# Patient Record
Sex: Female | Born: 1985 | Hispanic: Yes | Marital: Married | Smoking: Former smoker
Health system: Southern US, Community
[De-identification: ages and names within clinical notes are randomized; demographics above are authoritative.]

## PROBLEM LIST (undated history)

## (undated) ENCOUNTER — Inpatient Hospital Stay (HOSPITAL_COMMUNITY): Payer: Self-pay

## (undated) DIAGNOSIS — O139 Gestational [pregnancy-induced] hypertension without significant proteinuria, unspecified trimester: Secondary | ICD-10-CM

## (undated) DIAGNOSIS — O24420 Gestational diabetes mellitus in childbirth, diet controlled: Secondary | ICD-10-CM

## (undated) DIAGNOSIS — E669 Obesity, unspecified: Secondary | ICD-10-CM

## (undated) DIAGNOSIS — N051 Unspecified nephritic syndrome with focal and segmental glomerular lesions: Secondary | ICD-10-CM

## (undated) DIAGNOSIS — N183 Chronic kidney disease, stage 3 (moderate): Secondary | ICD-10-CM

## (undated) HISTORY — DX: Chronic kidney disease, stage 3 (moderate): N18.3

## (undated) HISTORY — PX: NO PAST SURGERIES: SHX2092

## (undated) HISTORY — DX: Unspecified nephritic syndrome with focal and segmental glomerular lesions: N05.1

---

## 2007-02-15 ENCOUNTER — Inpatient Hospital Stay (HOSPITAL_COMMUNITY): Admission: AD | Admit: 2007-02-15 | Discharge: 2007-02-15 | Payer: Self-pay | Admitting: Family Medicine

## 2011-06-18 LAB — URINE CULTURE: Colony Count: 30000

## 2011-06-18 LAB — URINALYSIS, ROUTINE W REFLEX MICROSCOPIC
Glucose, UA: NEGATIVE
Ketones, ur: NEGATIVE
Nitrite: NEGATIVE
Protein, ur: 100 — AB

## 2011-06-18 LAB — CBC
Hemoglobin: 14
MCHC: 34
RBC: 4.87
WBC: 13.5 — ABNORMAL HIGH

## 2011-06-18 LAB — URINE MICROSCOPIC-ADD ON

## 2011-06-18 LAB — WET PREP, GENITAL: Trich, Wet Prep: NONE SEEN

## 2011-06-18 LAB — GC/CHLAMYDIA PROBE AMP, GENITAL: GC Probe Amp, Genital: NEGATIVE

## 2011-06-18 LAB — ABO/RH: ABO/RH(D): O POS

## 2011-06-18 LAB — HCG, QUANTITATIVE, PREGNANCY: hCG, Beta Chain, Quant, S: 16960 — ABNORMAL HIGH

## 2011-06-18 LAB — POCT PREGNANCY, URINE: Operator id: 113551

## 2011-09-02 NOTE — L&D Delivery Note (Signed)
Delivery Note At 10:28 PM a viable female was delivered via OA Presentation APGAR: 9 9  Placenta status: ,normal with 3 vessel cord  Cord:  with the following complications: tight nuchal cord x 1  Cord pH: not done  Anesthesia:epidural   Episiotomy:none  Lacerationsfirst :  Suture Repair: 3.0 chromic Est. Blood Loss (mL): 300  Mom to AICU.  Baby to NICU NICU in attendance. Patient to go to AICU tonight on Magnesium 1 gram per hour Recheck CBC, CMET in am Nephrology to see patient tomorrow.  Shereka Lafortune L 02/21/2012, 10:39 PM

## 2011-10-08 LAB — OB RESULTS CONSOLE GC/CHLAMYDIA: Chlamydia: NEGATIVE

## 2012-01-11 ENCOUNTER — Encounter (HOSPITAL_COMMUNITY): Payer: Self-pay | Admitting: *Deleted

## 2012-01-11 ENCOUNTER — Inpatient Hospital Stay (HOSPITAL_COMMUNITY): Payer: PRIVATE HEALTH INSURANCE

## 2012-01-11 ENCOUNTER — Inpatient Hospital Stay (HOSPITAL_COMMUNITY)
Admission: AD | Admit: 2012-01-11 | Discharge: 2012-01-11 | Disposition: A | Discharge status: Hospice | Payer: PRIVATE HEALTH INSURANCE | Source: Ambulatory Visit | Attending: Obstetrics and Gynecology | Admitting: Obstetrics and Gynecology

## 2012-01-11 DIAGNOSIS — R109 Unspecified abdominal pain: Secondary | ICD-10-CM | POA: Insufficient documentation

## 2012-01-11 DIAGNOSIS — O26879 Cervical shortening, unspecified trimester: Secondary | ICD-10-CM

## 2012-01-11 DIAGNOSIS — O368190 Decreased fetal movements, unspecified trimester, not applicable or unspecified: Secondary | ICD-10-CM | POA: Insufficient documentation

## 2012-01-11 HISTORY — DX: Obesity, unspecified: E66.9

## 2012-01-11 LAB — URIC ACID: Uric Acid, Serum: 7.7 mg/dL — ABNORMAL HIGH (ref 2.4–7.0)

## 2012-01-11 LAB — CBC
HCT: 35.8 % — ABNORMAL LOW (ref 36.0–46.0)
MCHC: 33 g/dL (ref 30.0–36.0)
MCV: 85.2 fL (ref 78.0–100.0)
Platelets: 318 10*3/uL (ref 150–400)
RDW: 15 % (ref 11.5–15.5)

## 2012-01-11 LAB — COMPREHENSIVE METABOLIC PANEL
ALT: 17 U/L (ref 0–35)
Albumin: 2.6 g/dL — ABNORMAL LOW (ref 3.5–5.2)
Alkaline Phosphatase: 89 U/L (ref 39–117)
BUN: 15 mg/dL (ref 6–23)
Chloride: 101 mEq/L (ref 96–112)
GFR calc Af Amer: 73 mL/min — ABNORMAL LOW (ref >90)
Glucose, Bld: 105 mg/dL — ABNORMAL HIGH (ref 70–99)
Potassium: 3.7 mEq/L (ref 3.5–5.1)
Sodium: 133 mEq/L — ABNORMAL LOW (ref 135–145)
Total Bilirubin: 0.2 mg/dL — ABNORMAL LOW (ref 0.3–1.2)
Total Protein: 6.3 g/dL (ref 6.0–8.3)

## 2012-01-11 LAB — URINALYSIS, ROUTINE W REFLEX MICROSCOPIC
Bilirubin Urine: NEGATIVE
Glucose, UA: NEGATIVE mg/dL
Protein, ur: 100 mg/dL — AB
Urobilinogen, UA: 0.2 mg/dL (ref 0.0–1.0)

## 2012-01-11 LAB — DIFFERENTIAL
Basophils Absolute: 0 10*3/uL (ref 0.0–0.1)
Basophils Relative: 0 % (ref 0–1)
Eosinophils Relative: 2 % (ref 0–5)
Monocytes Absolute: 0.7 10*3/uL (ref 0.1–1.0)

## 2012-01-11 LAB — URINE MICROSCOPIC-ADD ON

## 2012-01-11 NOTE — MAU Note (Signed)
Pt reports she has noticed facial ans ankle swelling for the past 2-3 days. Repots she has started to have abd/ bilateral flank pain on and off that started this morining and has not felt baby move as much today.

## 2012-01-11 NOTE — MAU Note (Signed)
Pt c/o pain in bilat side of lower abd, upper abd,and lower abd that sometimes goes around to her back.  No c/o dysuria.

## 2012-01-11 NOTE — MAU Provider Note (Signed)
Chief Complaint:  Abdominal Pain and Facial Swelling      HPI  Laura Robinson is a 26 y.o. G1P0 at [redacted]w[redacted]d presenting with decreased FM x 1 day, but since arrival in MAU perceives normal FM. Today noted increased edemalof feet, hands and face. Denies H/A, visual disturbance or decreased UOP. Denies contractions, leakage of fluid or vaginal bleeding.   Pt also c/o low abd pain and upper abdominal pain starting today.   Pregnancy Course: uncomplicated  Past Medical History: Past Medical History  Diagnosis Date  . Obesity     Past Surgical History: Past Surgical History  Procedure Date  . No past surgeries     Family History: Family History  Problem Relation Age of Onset  . Asthma Brother   . Hypertension Brother   . Kidney disease Brother   . Cancer Maternal Grandmother   . Hypertension Maternal Grandmother     Social History: History  Substance Use Topics  . Smoking status: Former Smoker    Quit date: 07/14/2011  . Smokeless tobacco: Not on file  . Alcohol Use: Yes     prior to pregnancy    Allergies: No Known Allergies  Meds:  No prescriptions prior to admission      Physical Exam  Blood pressure 140/86, pulse 82, temperature 98.4 F (36.9 C), temperature source Oral, resp. rate 18, height 5\' 2"  (1.575 m), weight 102.15 kg (225 lb 3.2 oz). GENERAL: Well-developed, well-nourished female in no acute distress.  HEENT: normocephalic, good dentition, noticeable facial puffiness HEART: normal rate RESP: normal effort ABDOMEN: Soft, nontender, nondistended, gravid.  EXTREMITIES: Nontender, 3+ pedal edema, 2+ DTRs, no clonus NEURO: alert and oriented    FHT:  Baseline 140 , moderate variability, 10 bpm accelerations present, occ mild variable decelerations Contractions: none   Labs:  Results for orders placed during the hospital encounter of 01/11/12 (from the past 24 hour(s))  URINALYSIS, ROUTINE W REFLEX MICROSCOPIC     Status: Abnormal   Collection Time    01/11/12  4:06 PM      Component Value Range   Color, Urine YELLOW  YELLOW    APPearance CLEAR  CLEAR    Specific Gravity, Urine 1.010  1.005 - 1.030    pH 6.5  5.0 - 8.0    Glucose, UA NEGATIVE  NEGATIVE (mg/dL)   Hgb urine dipstick TRACE (*) NEGATIVE    Bilirubin Urine NEGATIVE  NEGATIVE    Ketones, ur NEGATIVE  NEGATIVE (mg/dL)   Protein, ur 161 (*) NEGATIVE (mg/dL)   Urobilinogen, UA 0.2  0.0 - 1.0 (mg/dL)   Nitrite NEGATIVE  NEGATIVE    Leukocytes, UA NEGATIVE  NEGATIVE   URINE MICROSCOPIC-ADD ON     Status: Abnormal   Collection Time   01/11/12  4:06 PM      Component Value Range   Squamous Epithelial / LPF FEW (*) RARE    WBC, UA 0-2  <3 (WBC/hpf)   RBC / HPF 0-2  <3 (RBC/hpf)  COMPREHENSIVE METABOLIC PANEL     Status: Abnormal   Collection Time   01/11/12  4:45 PM      Component Value Range   Sodium 133 (*) 135 - 145 (mEq/L)   Potassium 3.7  3.5 - 5.1 (mEq/L)   Chloride 101  96 - 112 (mEq/L)   CO2 20  19 - 32 (mEq/L)   Glucose, Bld 105 (*) 70 - 99 (mg/dL)   BUN 15  6 - 23 (mg/dL)   Creatinine, Ser  1.19 (*) 0.50 - 1.10 (mg/dL)   Calcium 9.4  8.4 - 16.1 (mg/dL)   Total Protein 6.3  6.0 - 8.3 (g/dL)   Albumin 2.6 (*) 3.5 - 5.2 (g/dL)   AST 18  0 - 37 (U/L)   ALT 17  0 - 35 (U/L)   Alkaline Phosphatase 89  39 - 117 (U/L)   Total Bilirubin 0.2 (*) 0.3 - 1.2 (mg/dL)   GFR calc non Af Amer 63 (*) >90 (mL/min)   GFR calc Af Amer 73 (*) >90 (mL/min)  CBC     Status: Abnormal   Collection Time   01/11/12  4:45 PM      Component Value Range   WBC 15.3 (*) 4.0 - 10.5 (K/uL)   RBC 4.20  3.87 - 5.11 (MIL/uL)   Hemoglobin 11.8 (*) 12.0 - 15.0 (g/dL)   HCT 09.6 (*) 04.5 - 46.0 (%)   MCV 85.2  78.0 - 100.0 (fL)   MCH 28.1  26.0 - 34.0 (pg)   MCHC 33.0  30.0 - 36.0 (g/dL)   RDW 40.9  81.1 - 91.4 (%)   Platelets 318  150 - 400 (K/uL)  DIFFERENTIAL     Status: Abnormal   Collection Time   01/11/12  4:45 PM      Component Value Range   Neutrophils Relative 78 (*) 43 - 77  (%)   Neutro Abs 11.9 (*) 1.7 - 7.7 (K/uL)   Lymphocytes Relative 16  12 - 46 (%)   Lymphs Abs 2.4  0.7 - 4.0 (K/uL)   Monocytes Relative 5  3 - 12 (%)   Monocytes Absolute 0.7  0.1 - 1.0 (K/uL)   Eosinophils Relative 2  0 - 5 (%)   Eosinophils Absolute 0.3  0.0 - 0.7 (K/uL)   Basophils Relative 0  0 - 1 (%)   Basophils Absolute 0.0  0.0 - 0.1 (K/uL)  URIC ACID     Status: Abnormal   Collection Time   01/11/12  4:45 PM      Component Value Range   Uric Acid, Serum 7.7 (*) 2.4 - 7.0 (mg/dL)    Imaging:  EFW 78%GNF, AFI 15, Cervix shortened at 1.8 cm with 2.2 cm of funneling  Assessment: ? Evolving preeclampsia Shortened cervix  Plan: Dr. Vincente Poli consulted Call office tomorrow for appt on Tuesday morning for office visit/ultrasound 24 hour urine - return to office on Tuesday Precautions rev'd Bedrest until reevaluated    POE,DEIRDRE 5/12/20134:44 PM Georges Mouse assumed care at 1925

## 2012-01-13 ENCOUNTER — Inpatient Hospital Stay (HOSPITAL_COMMUNITY)
Admission: AD | Admit: 2012-01-13 | Discharge: 2012-01-20 | DRG: 781 | Disposition: A | Discharge status: Home / Self Care | Payer: PRIVATE HEALTH INSURANCE | Source: Ambulatory Visit | Attending: Obstetrics and Gynecology | Admitting: Obstetrics and Gynecology

## 2012-01-13 ENCOUNTER — Encounter (HOSPITAL_COMMUNITY): Payer: Self-pay | Admitting: General Practice

## 2012-01-13 DIAGNOSIS — O4700 False labor before 37 completed weeks of gestation, unspecified trimester: Secondary | ICD-10-CM | POA: Diagnosis present

## 2012-01-13 DIAGNOSIS — O99810 Abnormal glucose complicating pregnancy: Secondary | ICD-10-CM | POA: Diagnosis present

## 2012-01-13 DIAGNOSIS — N289 Disorder of kidney and ureter, unspecified: Secondary | ICD-10-CM | POA: Diagnosis present

## 2012-01-13 DIAGNOSIS — IMO0002 Reserved for concepts with insufficient information to code with codable children: Secondary | ICD-10-CM | POA: Diagnosis present

## 2012-01-13 DIAGNOSIS — R809 Proteinuria, unspecified: Secondary | ICD-10-CM

## 2012-01-13 DIAGNOSIS — O479 False labor, unspecified: Secondary | ICD-10-CM

## 2012-01-13 DIAGNOSIS — O26879 Cervical shortening, unspecified trimester: Principal | ICD-10-CM | POA: Diagnosis present

## 2012-01-13 HISTORY — DX: Gestational (pregnancy-induced) hypertension without significant proteinuria, unspecified trimester: O13.9

## 2012-01-13 MED ORDER — DOCUSATE SODIUM 100 MG PO CAPS
100.0000 mg | ORAL_CAPSULE | Freq: Every day | ORAL | Status: DC
  Administered 2012-01-14 – 2012-01-20 (×7): 100 mg via ORAL
  Filled 2012-01-13 (×7): qty 1

## 2012-01-13 MED ORDER — PROGESTERONE MICRONIZED 200 MG PO CAPS
200.0000 mg | ORAL_CAPSULE | Freq: Every day | ORAL | Status: DC
  Administered 2012-01-13 – 2012-01-19 (×7): 200 mg via VAGINAL
  Filled 2012-01-13 (×8): qty 1

## 2012-01-13 MED ORDER — ACETAMINOPHEN 325 MG PO TABS
650.0000 mg | ORAL_TABLET | ORAL | Status: DC | PRN
  Administered 2012-01-17: 650 mg via ORAL
  Filled 2012-01-13: qty 2

## 2012-01-13 MED ORDER — LACTATED RINGERS IV SOLN
INTRAVENOUS | Status: DC
  Administered 2012-01-13 – 2012-01-14 (×2): via INTRAVENOUS

## 2012-01-13 MED ORDER — CALCIUM CARBONATE ANTACID 500 MG PO CHEW: 2.0000 | CHEWABLE_TABLET | ORAL | Status: DC | PRN

## 2012-01-13 MED ORDER — BETAMETHASONE SOD PHOS & ACET 6 (3-3) MG/ML IJ SUSP
12.0000 mg | INTRAMUSCULAR | Status: AC
  Administered 2012-01-13 – 2012-01-14 (×2): 12 mg via INTRAMUSCULAR
  Filled 2012-01-13 (×2): qty 2

## 2012-01-13 MED ORDER — PRENATAL MULTIVITAMIN CH
1.0000 | ORAL_TABLET | Freq: Every day | ORAL | Status: DC
  Administered 2012-01-14 – 2012-01-20 (×7): 1 via ORAL
  Filled 2012-01-13 (×7): qty 1

## 2012-01-13 MED ORDER — ZOLPIDEM TARTRATE 10 MG PO TABS: 10.0000 mg | ORAL_TABLET | Freq: Every evening | ORAL | Status: DC | PRN

## 2012-01-13 NOTE — H&P (Signed)
  [facility]  DICTATION # [id number] CSN# [id number]   Meriel Pica, MD 01/13/2012 6:16 PM

## 2012-01-14 LAB — PROTEIN, URINE, 24 HOUR
Protein, 24H Urine: 4033 mg/d — ABNORMAL HIGH (ref 50–100)
Protein, Urine: 109 mg/dL
Urine Total Volume-UPROT: 3700 mL

## 2012-01-14 LAB — CREATININE CLEARANCE, URINE, 24 HOUR
Collection Interval-CRCL: 24 hours
Creatinine Clearance: 92 mL/min (ref 75–115)
Creatinine, 24H Ur: 1575 mg/d (ref 700–1800)
Creatinine, Urine: 42.57 mg/dL

## 2012-01-14 NOTE — Progress Notes (Signed)
UR chart review completed.  

## 2012-01-14 NOTE — Progress Notes (Signed)
  Subjective: Pt without complaints No HA  Objective: Blood pressure 129/83, pulse 99, temperature 98.3 F (36.8 C), temperature source Oral, resp. rate 18, height 5\' 2"  (1.575 m), weight 100.245 kg (221 lb).  Physical Exam:  General: alert, cooperative, appears stated age and no distress  DVT Evaluation: No evidence of DVT seen on physical exam. DTRs 2/4   Basename 01/11/12 1645  HGB 11.8*  HCT 35.8*    Assessment/Plan: IUP at 27 4/7 Mild Preeclampsia stable on BR Stable labs and sxs.  24 h urine in progress.  If stable, consider outpt management PTL - shortened cervix.  Cont BR and Prometrium.  No sxs of labor   LOS: 1 day   Andie Mungin C 01/14/2012, 8:56 AM

## 2012-01-15 LAB — OB RESULTS CONSOLE RPR: RPR: NONREACTIVE

## 2012-01-15 LAB — COMPREHENSIVE METABOLIC PANEL
Albumin: 2.5 g/dL — ABNORMAL LOW (ref 3.5–5.2)
BUN: 20 mg/dL (ref 6–23)
Calcium: 9.2 mg/dL (ref 8.4–10.5)
GFR calc Af Amer: 71 mL/min — ABNORMAL LOW (ref >90)
Glucose, Bld: 218 mg/dL — ABNORMAL HIGH (ref 70–99)
Total Protein: 5.8 g/dL — ABNORMAL LOW (ref 6.0–8.3)

## 2012-01-15 LAB — CBC
HCT: 35.5 % — ABNORMAL LOW (ref 36.0–46.0)
Hemoglobin: 11.4 g/dL — ABNORMAL LOW (ref 12.0–15.0)
MCH: 28.1 pg (ref 26.0–34.0)
MCHC: 32.1 g/dL (ref 30.0–36.0)
RDW: 15.2 % (ref 11.5–15.5)

## 2012-01-15 LAB — OB RESULTS CONSOLE ANTIBODY SCREEN: Antibody Screen: NEGATIVE

## 2012-01-15 LAB — OB RESULTS CONSOLE HEPATITIS B SURFACE ANTIGEN: Hepatitis B Surface Ag: NEGATIVE

## 2012-01-15 NOTE — H&P (Signed)
NAMEAMERICA, Laura Robinson                ACCOUNT NO.:  [id number]  MEDICAL RECORD NO.:  [id number]  LOCATION:                                 FACILITY:  PHYSICIAN:  Duke Salvia. Marcelle Overlie, M.D.DATE OF BIRTH:  06-14-1986  DATE OF ADMISSION:  01/13/2012 DATE OF DISCHARGE:                             HISTORY & PHYSICAL   CHIEF COMPLAINT:  Short cervix at 27-2/7th weeks, mild preeclampsia.  HISTORY OF PRESENT ILLNESS:  A 26 year old, G2, P 0-0-1-0, EDD April 10, 2012, 27 weeks and 2 days, this patient was seen 2 days ago at [facility]. Evaluation there showed by ultrasound she had a short cervix at 1.8 cm with funneling, 1+ protein on urine, PIH labs that were normal for LFTs and platelet count.  She was told to follow back in the office today for return of a 24-hour urine.  Unfortunately, this was not collected correctly.  Followup ultrasound today in our office, the cervix was 1.7 with funneling noted.  Urine showed 3+ protein.  BP 126/82.  The patient is now admitted for betamethasone series and further monitoring.  Blood type is O positive.  The remainder of her prenatal blood work was normal.  Please see her [facility] form for past medical history details.  PHYSICAL EXAMINATION:  VITAL SIGNS:  Temp 98.2 and blood pressure 126/82. HEENT:  Unremarkable. NECK:  Supple without masses. LUNGS:  Clear. CARDIOVASCULAR:  Regular rate and rhythm without murmurs, rubs, or gallops noted. BREASTS:  Not examined. PELVIC:  27-cm fundal height.  Fetal heart rate 140.  Cervix clinically was closed. EXTREMITIES:  1+ reflexes and trace edema.  IMPRESSION: 1. 27-2/7th week intrauterine pregnancy. 2. Mild preeclampsia with significant proteinuria. 3. Preterm cervical changes 1.7 cm with funneling noted.  PLAN:  We will admit for 24-hour urine, further monitoring, vaginal progesterone, betamethasone series.     Laura Robinson M. Marcelle Overlie, M.D.     RMH/MEDQ  D:  01/13/2012  T:  01/13/2012  Job:  [id number]

## 2012-01-15 NOTE — Progress Notes (Signed)
Patient ID: Laura Robinson, female   DOB: [birth year], 26 y.o.   MRN: [id number] S: NO PIH SX'S O: BP 131/65 VSS      GRAVID UTERUS NONTENDER      DTR'S 2+      GOOD FM      24 HOUR URINE 4033 MG OF PROTIEN A: IUP AT 27+     PRE-ECLAMPSIA     SHORTENED CERVIX P:  REPEAT LABS IF OK D/C HOME

## 2012-01-15 NOTE — Progress Notes (Addendum)
Patient ID: Laura Robinson, female   DOB: 1986-02-25, 26 y.o.   MRN: [id number] On labs creatinine increased to 1.22 noted creatinine clearance was 92 May be seeing significant effect on renal system Will repeat 24 hr urine and not d/c home sono on 5/12 normal afi and growth

## 2012-01-16 ENCOUNTER — Inpatient Hospital Stay (HOSPITAL_COMMUNITY): Payer: PRIVATE HEALTH INSURANCE

## 2012-01-16 LAB — PROTEIN, URINE, 24 HOUR
Collection Interval-UPROT: 24 hours
Protein, 24H Urine: 4762 mg/d — ABNORMAL HIGH (ref 50–100)
Protein, Urine: 107 mg/dL
Urine Total Volume-UPROT: 4450 mL

## 2012-01-16 LAB — GLUCOSE TOLERANCE, 1 HOUR: Glucose, 1 Hour GTT: 213 mg/dL — ABNORMAL HIGH (ref 70–140)

## 2012-01-16 LAB — CREATININE CLEARANCE, URINE, 24 HOUR
Collection Interval-CRCL: 24 hours
Creatinine Clearance: 89 mL/min (ref 75–115)
Creatinine, 24H Ur: 1561 mg/d (ref 700–1800)
Creatinine, Urine: 35.07 mg/dL
Urine Total Volume-CRCL: 4450 mL

## 2012-01-16 MED ORDER — SODIUM CHLORIDE 0.9 % IJ SOLN
3.0000 mL | Freq: Two times a day (BID) | INTRAMUSCULAR | Status: DC
  Administered 2012-01-16 – 2012-01-17 (×2): 3 mL via INTRAVENOUS

## 2012-01-16 NOTE — Progress Notes (Signed)
Patient ID: Laura Robinson, female   DOB: 1986-08-16, 26 y.o.   MRN: [id number] [redacted]w[redacted]d BP 126/69  Pulse 80  Temp(Src) 98.3 F (36.8 C) (Oral)  Resp 16  Ht 5\' 2"  (1.575 m)  Wt 225 lb 9.6 oz (102.331 kg)  BMI 41.26 kg/m2  Had 1 hr GTT, second 24 urine in progress  Will requestMFM consult today

## 2012-01-17 NOTE — Progress Notes (Signed)
1355- okay with dr. Marcelle Overlie to d/c saline lock.

## 2012-01-17 NOTE — Progress Notes (Signed)
MFM Note (late entry)  Ms.Resler is a 26 year old G64P0A1 female at 27+[redacted] week gestation who was admitted on 05/14 for evaluation of possible preeclampsia and a shortened cervix with funneling. Ms. Staggs states that her pregnancy had been uneventful until last week when she noticed some peripheral edema, decreased fetal movement and abdominal pain. In the office, she was found to have significant proteinuria on dipstick and a funneled cervix with distal closed portion measuring 1.7 cms.  On the day of admission, her BP was 126/82 and she denied headache, change in vision, RUQ abdominal pain or shortness of breath.   Since hospitalization, two 24 hour urine collections have returned abnormal with 4+ grams of protein with a decreased CrCl. All of the labs, including HELLP labs,  were normal except for elevated serum creatinines of 1.19 and 1.22. I am unaware of any prior baseline renal function tests. Her blood pressures have all been under 140/90. A recent fetal ultrasound on 05/12 revealed excellent growth with the EFW at the 71st %tile, normal AFV, no gross structural abnormalities and a funneled cervix measuring 1.8 cms. She reports good fetal movement.   She received a course of BMZ on 05/14 and 05/15. Subsequent blood glucoses have been elevated but this may be due to the steroids.  Ms. Dunford denies any medical conditions and is currently taking only a prenatal vitamin. She denies having any problems with her heart, lungs, kidneys, GI tract, skin or joints.   Assessment: 1) IUP at 27+6 weeks 2) New onset of significant proteinuria with elevated serum creatinine and decreased CrCL in a setting of normal blood pressures, HELLP labs and fetal status  - atypical presentation of preeclampsia vs primary renal disease vs other systemic disorder 3) Funneling of internal os with distal closed portion measuring 1.8 cms 4) Fetal status reassuring 5) Obesity 6) Elevated BSs  Recommendations: 1) Consultation  with a nephrologist: question - any thoughts on primary renal disease or any other systemic causes of proteinuria (other than preeclampsia)?  2) Obtain uric acid and HgbA1c; would wait at least one week after BMZ before further evaluation of gestational diabetes 3) Agree with vaginal progesterone 4) Close observation for worsening of renal disease or development of other indicators of preeclampsia - inpt or outpt  Thank you for the kind referral.  (Face-to-face consultation with patient: 30 min)

## 2012-01-17 NOTE — Progress Notes (Signed)
Patient ID: Laura Robinson, female   DOB: 03-Jan-1986, 26 y.o.   MRN: [id number] [redacted]w[redacted]d   S/  No Ha, or scotomata  O/  BP 126/85  Pulse 95  Temp(Src) 97.9 F (36.6 C) (Oral)  Resp 20  Ht 5\' 2"  (1.575 m)  Wt 220 lb 1.6 oz (99.837 kg)  BMI 40.26 kg/m2  No LE edema, DTR's 1+, no clnus, stable FHR  Second 24 hr urine showed 4.7 gms with Crcl = 89.   Discussed last PM with Dr Sherrie George after MFM consult, discussed renal consult to eval pre-existing renal dz vs atypical Pre-E.  Will repeat labs Monday AM and keep pt here for further eval.  3hr GTT Monday am

## 2012-01-18 MED ORDER — FAMOTIDINE 20 MG PO TABS
20.0000 mg | ORAL_TABLET | Freq: Two times a day (BID) | ORAL | Status: DC
  Administered 2012-01-18 – 2012-01-20 (×4): 20 mg via ORAL
  Filled 2012-01-18 (×4): qty 1

## 2012-01-18 NOTE — Progress Notes (Signed)
Pt out for wheelchair ride with husband

## 2012-01-18 NOTE — Progress Notes (Signed)
Patient ID: Laura Robinson, female   DOB: 1986-05-16, 26 y.o.   MRN: [id number] [redacted]w[redacted]d  S/  Good FM  O/  BP 125/82  Pulse 96  Temp(Src) 98.2 F (36.8 C) (Oral)  Resp 20  Ht 5\' 2"  (1.575 m)  Wt 220 lb 1.6 oz (99.837 kg)  BMI 40.26 kg/m2  FHR stable  A+P/  Will call renal today re consult>>repeat CMET in AM with 3hr GTT and Hgb A1C

## 2012-01-18 NOTE — Progress Notes (Signed)
1730- dr. Marcelle Overlie notified of pt's c/o epigastric pain. Orders received for pepsid 20 mg po bid prn.

## 2012-01-19 LAB — COMPREHENSIVE METABOLIC PANEL
Alkaline Phosphatase: 90 U/L (ref 39–117)
BUN: 21 mg/dL (ref 6–23)
CO2: 19 mEq/L (ref 19–32)
Calcium: 8.9 mg/dL (ref 8.4–10.5)
GFR calc Af Amer: 72 mL/min — ABNORMAL LOW (ref >90)
GFR calc non Af Amer: 62 mL/min — ABNORMAL LOW (ref >90)
Glucose, Bld: 111 mg/dL — ABNORMAL HIGH (ref 70–99)
Potassium: 4.1 mEq/L (ref 3.5–5.1)
Total Protein: 6 g/dL (ref 6.0–8.3)

## 2012-01-19 LAB — GLUCOSE, FASTING GESTATIONAL: Glucose Tolerance, Fasting: 111 mg/dL

## 2012-01-19 LAB — GLUCOSE, 3 HOUR GESTATIONAL: Glucose, GTT - 3 Hour: 199 mg/dL — ABNORMAL HIGH (ref 70–144)

## 2012-01-19 NOTE — Progress Notes (Signed)
Ur chart review completed.  

## 2012-01-19 NOTE — Progress Notes (Signed)
Patient ID: Laura Robinson, female   DOB: [birth year], 26 y.o.   MRN: [id number] S: NO PIH SYMPTOMS O:  BP 111/69   141/79       DTR'S 2+       FHR LAST PM 150'S REACTIVE NO DECELS       LABS:  CREATININE STILL ELEVATED 1.21  LFT'S NORMAL A:  IUP AT 28.2      PIH      DECREASE RENAL FUNCTION     PROBABLE GEST DM P:  NEPHROLOGY CONSULT       COMPLETE 3 HOUR GTT

## 2012-01-19 NOTE — Progress Notes (Signed)
28.[redacted]  weeks gestation, with PIH, GDM.  Height  62" Weight 217 Lbs pre-pregnancy weight 202 Lbs.Pre-pregnancy  BMI 36.9 ( class II obesity)  IBW 110 Lbs  Total weight gain 15 Lbs. Weight gain goals 11-20 Lbs.   Estimated needs: 18-2000 kcal/day, 66-76 grams protein/day, 2.1 liters fluid/day Carb mod gestational diet tolerated well, appetite good. Pt has lost 8 Lbs since admission but assures me that she is eating quite well Current diet prescription will provide for increased needs. Labs: 3 hr GTT 111/280/230/199, HA1C 5.8  l Nutrition Dx: Food and nutrition-related knowledge deficit r/t no previous education aeb newly diagnosed GDM.  Marland Kitchen  Nutrition education consult for Carbohydrate Modified Gestational Diabetic Diet completed.  "Meal  plan for gestational diabetics" handout given to patient.  Basic concepts reviewed.  Questions answered.  Patient verbalizes understanding.

## 2012-01-20 ENCOUNTER — Inpatient Hospital Stay (HOSPITAL_COMMUNITY): Payer: PRIVATE HEALTH INSURANCE

## 2012-01-20 LAB — GLUCOSE, CAPILLARY
Glucose-Capillary: 97 mg/dL (ref 70–99)
Glucose-Capillary: 82 mg/dL (ref 70–99)
Glucose-Capillary: 103 mg/dL — ABNORMAL HIGH (ref 70–99)

## 2012-01-20 LAB — CBC
HCT: 38.1 % (ref 36.0–46.0)
MCHC: 32.3 g/dL (ref 30.0–36.0)
MCV: 87.6 fL (ref 78.0–100.0)
RDW: 15.5 % (ref 11.5–15.5)
WBC: 16.6 10*3/uL — ABNORMAL HIGH (ref 4.0–10.5)

## 2012-01-20 LAB — COMPREHENSIVE METABOLIC PANEL
Albumin: 2.4 g/dL — ABNORMAL LOW (ref 3.5–5.2)
BUN: 23 mg/dL (ref 6–23)
Chloride: 101 mEq/L (ref 96–112)
Creatinine, Ser: 1.32 mg/dL — ABNORMAL HIGH (ref 0.50–1.10)
Total Bilirubin: 0.2 mg/dL — ABNORMAL LOW (ref 0.3–1.2)

## 2012-01-20 MED ORDER — PROGESTERONE MICRONIZED 200 MG PO CAPS: 200.0000 mg | ORAL_CAPSULE | Freq: Every day | ORAL | Status: DC

## 2012-01-20 NOTE — Progress Notes (Signed)
Patient ID: Laura Robinson, female   DOB: 1986-07-31, 26 y.o.   MRN: [id number] Pt without complaints  BPs 118/65 - 135/91  Glc fasting this am 97   2h pp 120 -190  FHR 140s Ctx Rare  Abd  Gravid Nt LE:  Trace edema  DTRs 2/4  IUP at 28 3/7 Renal insufficiency - Nephrology consult pending (Dr Arlean Hopping) No evidence of Preeclampsia at this time Shortened cervix - continue present care Gest DM -diet for for now.   Unsure of lingering effects of BMZ DL

## 2012-01-20 NOTE — Progress Notes (Signed)
MD talking with pt about POC and discharge.

## 2012-01-20 NOTE — Progress Notes (Signed)
MD returning page stating that he would be coming to see pt today

## 2012-01-20 NOTE — Consult Note (Addendum)
Laura Robinson [facility] 01/20/2012 Shawntee Mainwaring D Requesting Physician:  Dr Marcelle Overlie      Reason for Consult:  Proteinuria HPI: The patient is a 26 y.o. year-old AAF with no significant PMH admitted to [facility] for workup of proteinuria discovered within the past few weeks. She is 29-[redacted] wks pregnant, last UA was 3+ for proteinuria. The prior two UA's were 1+. No hx of any medical or renal disease.  She has had one abortion. No hx HTN and is not hypertensive now.  Not on any medications. No hx of lupus, DM, HIV.   Date BP  Dipstick proteinuria   Serum Creat = est GFR 2/15 126/80  N/A     - 3/13 124/76  1+     -  4/15 124/78  1+     -  5/14 126/82  3+    Cr 1.2 = 72 mL/min  Does not smoke, drink or do drugs. Denies recent joint pain, rash, hair loss, foamy urine, difficulty voiding, hematuria.  UA is negative except for protein+. 24 hr collections x 2 for protein came back at 4.0 and 4.7 gm/24 hrs. Total creat is 1.5 gm/day signifying adequate collection.   Creatinine  Date/Time Value Range Status  01/15/2012  1:20 PM 1.22* 0.50-1.10 (mg/dL) Final  12/08/8117  1:47 PM 1.19* 0.50-1.10 (mg/dL) Final     Creatinine, Ser  Date/Time Value Range Status  01/19/2012  5:05 AM 1.21* 0.50-1.10 (mg/dL) Final  04/29/5620  3:08 AM 1.22* 0.50-1.10 (mg/dL) Final  6/57/8469  6:29 PM 1.19* 0.50-1.10 (mg/dL) Final    Past Medical History:  Past Medical History  Diagnosis Date  . Obesity   . Pregnancy induced hypertension   . Preterm labor     Past Surgical History:  Past Surgical History  Procedure Date  . No past surgeries     Family History:  Family History  Problem Relation Age of Onset  . Asthma Brother   . Hypertension Brother   . Kidney disease Brother   . Cancer Maternal Grandmother   . Hypertension Maternal Grandmother    Social History:  reports that she quit smoking about 6 months ago. Her smoking use included Cigarettes. She has a 1 pack-year smoking history. She has never used smokeless  tobacco. She reports that she drinks alcohol. She reports that she does not use illicit drugs.  Allergies: No Known Allergies  Home medications: Prior to Admission medications   Medication Sig Start Date End Date Taking? Authorizing Provider  acetaminophen (TYLENOL) 325 MG tablet Take 650 mg by mouth every 6 (six) hours as needed. For pain   Yes Historical Provider, MD  Prenatal Vit-Fe Fumarate-FA (PRENATAL MULTIVITAMIN) TABS Take 1 tablet by mouth daily.   Yes Historical Provider, MD    Inpatient medications:    . docusate sodium  100 mg Oral Daily  . famotidine  20 mg Oral BID  . prenatal multivitamin  1 tablet Oral Daily  . progesterone  200 mg Vaginal QHS    Review of Systems Gen:  Denies headache, fever, chills, sweats.  No weight loss. HEENT:  No visual change, sore throat, difficulty swallowing. Resp:  No difficulty breathing, DOE.  No cough or hemoptysis. Cardiac:  No chest pain, orthopnea, PND.  Denies edema. GI:   Denies abdominal pain.   No nausea, vomiting, diarrhea.  No constipation. GU:  Denies difficulty or change in voiding.  No change in urine color.     MS:  Denies joint pain or swelling.   Derm:  Denies skin rash or itching.  No chronic skin conditions.  Neuro:   Denies focal weakness, memory problems, hx stroke or TIA.   Psych:  Denies symptoms of depression of anxiety.  No hallucination.    Labs: Basic Metabolic Panel:  Lab 01/19/12 4098 01/15/12 1320 01/15/12 0855 01/13/12 1835  NA 134* -- 134* --  K 4.1 -- 4.0 --  CL 105 -- 102 --  CO2 19 -- 19 --  GLUCOSE 111* -- 218* --  BUN 21 -- 20 --  CREATININE 1.21* 1.22* 1.22* 1.19*  ALB -- -- -- --  CALCIUM 8.9 -- 9.2 --  PHOS -- -- -- --   Liver Function Tests:  Lab 01/19/12 0505 01/15/12 0855  AST 21 15  ALT 17 18  ALKPHOS 90 90  BILITOT 0.2* 0.1*  PROT 6.0 5.8*  ALBUMIN 2.3* 2.5*   No results found for this basename: LIPASE:3,AMYLASE:3 in the last 168 hours No results found for this  basename: AMMONIA:3 in the last 168 hours CBC:  Lab 01/15/12 0855  WBC 19.5*  NEUTROABS --  HGB 11.4*  HCT 35.5*  MCV 87.7  PLT 325   PT/INR: @labrcntip (inr:5) Cardiac Enzymes: No results found for this basename: CKTOTAL:5,CKMB:5,CKMBINDEX:5,TROPONINI:5 in the last 168 hours CBG:  Lab 01/20/12 1059 01/20/12 0633 01/19/12 2030 01/19/12 1551  GLUCAP 82 97 123* 135*    Iron Studies: No results found for this basename: IRON:30,TIBC:30,TRANSFERRIN:30,FERRITIN:30 in the last 168 hours  Xrays/Other Studies: US Ob Limited  01/20/2012  OBSTETRICAL ULTRASOUND: This exam was performed within a [facility]. The OB US report was generated in the AS system, and faxed to the ordering physician.   This report is also available in [employer] and in the [employer]. See AS Obstetric US report.   US Ob Transvaginal  01/20/2012  OBSTETRICAL ULTRASOUND: This exam was performed within a [facility]. The OB US report was generated in the AS system, and faxed to the ordering physician.   This report is also available in [employer] and in the [employer]. See AS Obstetric US report.   US Fetal Bpp W/o Non Stress  01/20/2012  OBSTETRICAL ULTRASOUND: This exam was performed within a  Ultrasound Department. The OB US report was generated in the AS system, and faxed to the ordering physician.   This report is also available in [employer] and in the [employer]. See AS Obstetric US report.   US Renal  01/20/2012  *RADIOLOGY REPORT*  Clinical Data:  Decreased renal function in 28-week pregnant patient, hypertension  RENAL/URINARY TRACT ULTRASOUND COMPLETE  Comparison:  None.  Findings:  Right Kidney:  Normal in size and parenchymal echogenicity.  No evidence of mass or hydronephrosis.  Left Kidney:  Normal in size and parenchymal echogenicity.  No evidence of mass or hydronephrosis.   Bladder:  Appears normal for degree of bladder distention.  Diffuse fatty infiltration of the liver is incidentally noted.  IMPRESSION: Normal renal ultrasound.  Original Report Authenticated By: Brandon Melnick, M.D.    Physical Exam:  Blood pressure 117/65, pulse 90, temperature 98.4 F (36.9 C), temperature source Oral, resp. rate 18, height 5\' 2"  (1.575 m), weight 98.431 kg (217 lb).  Gen: alert, mild-mod obesity Skin: no rash, cyanosis Neck: no JVD, no bruits or LAN Chest: clear bilat, no rales or wheezes Heart: regular, no rub or gallop Abdomen: soft, gravid abdomen, no HSM, no ascites Ext: no ankle edema  Neuro: alert, Ox3, no focal deficit Heme/Lymph: no bruising or LAN   Assessment/Recommendations 1. Nephrotic-range proteinuria and mild renal insufficiency- in 26 yo previously healthy woman at 29 wks IUP. No hypertension present. Serum creatinine should be lower in a healthy pregnant woman, so the creatinine 1.2 probably indicates some renal insufficiency. She had 1+ dipstick proteinuria in Feb and March at 17 and 21 wks respectively. Suspect underlying renal disorder as cause, with exacerbation or proteinuria due to pregnancy, which is not uncommon.  Doubt preeclampsia with normal BP's, but this bears watching BP's closely. No evidence of systemic disease (DM, lupus, HIV) on initial exam- will send off some serologies, specificially HIV, ANA, RPR, hep B and C and C3/C4.  If all those are negative, she could have one of several other glomerular disorders, specifically FSGS, membranous nephropahty, minimal change disease, IgA. Nephropathy.  Renal US has been done and is normal.  There is no role for biopsy during pregnancy, unless renal function deteriorates. No indication for early delivery in absence of HTN / evidence of preeclampsia. Typical follow up would be to repeat quantitative proteinuria exam 12 weeks after delivery with nephrology follow-up, at which time the proteinuria of   85-90% of patients with preeclampia will have resolved.  Thank you for the referral, will follow.   2. Edema-  she does not have a lot of edema now, but did before she was on bedrest.  The best thing to do for this during pregnancy is sodium restricted diet to 1500 mg / day.  I've asked the nurses to arrange for instruction with dietary regarding this.  Diuretics are avoided during pregnancy unless there is severe intractable edema.     Vinson Moselle  MD [facility] [phone] pgr    [phone] cell 01/20/2012, 2:38 PM

## 2012-01-20 NOTE — Progress Notes (Signed)
Patient ID: Airiel Oblinger, female   DOB: [birth year], 26 y.o.   MRN: [id number] I had a long discussion with Dr Sherrie George (MFM) after the Nephrology consult regarding plan of care.  Then I discussed this with the patient and her husband and they would like dc home BPs 120-130s/80s DTRs 2/4 Trace edema [place] today normal kidneys/ Cx dynamic but no sig change  Nephrotic syndrome - Labs ordered to check on later this week.  Cont BR and is at high risk for Preeclampsia.  Needs 2x weekly office visits.  No need to repeat 24 h urine as it will cont to show large amounts of protein.  Needs weekly Preelampsia labs, monitor Serum creatine, and fetus needs weekly BPP and every 2 weeks EFW. Gest DM - glc monitor and diet Shortened Cervix - cont vaginal progesterone, Modified BR and PTL warnings.  DL

## 2012-01-20 NOTE — Discharge Instructions (Signed)
Glucose monitoring as shown Modified Bedrest as discussed Twice weekly office visits: Monday or Tuesday -Early week needs Preeclampsia labs and follow creatinine (weekly) Thursday or Friday  - Late week needs [place] with Biophysical Profile (weekly) and Fetal Growth every 2 weeks

## 2012-01-21 LAB — HEPATITIS B SURFACE ANTIGEN: Hepatitis B Surface Ag: NEGATIVE

## 2012-01-21 LAB — HEPATITIS B SURFACE ANTIBODY,QUALITATIVE: Hep B S Ab: POSITIVE — AB

## 2012-01-21 LAB — HEPATITIS B CORE ANTIBODY, IGM: Hep B C IgM: NEGATIVE

## 2012-01-21 LAB — HEPATITIS C ANTIBODY (REFLEX): HCV Ab: NEGATIVE

## 2012-01-21 LAB — ANA: Anti Nuclear Antibody(ANA): NEGATIVE

## 2012-01-22 NOTE — Discharge Summary (Signed)
Obstetric Discharge Summary Reason for Admission: observation/evaluation Prenatal Procedures: ultrasound Intrapartum Procedures: na Postpartum Procedures: na Complications-Operative and Postpartum: na Hemoglobin  Date Value Range Status  01/20/2012 12.3  12.0-15.0 (g/dL) Final     HCT  Date Value Range Status  01/20/2012 38.1  36.0-46.0 (%) Final    Physical Exam:  General: alert and cooperative Lochia: na Uterine Fundus: soft Incision: na DVT Evaluation: No evidence of DVT seen on physical exam.  Discharge Diagnoses: Preelampsia and shortened cervix  Discharge Information: Date: 01/22/2012 Activity: pelvic rest Diet: routine Medications: PNV,vaginal progesterone supp Condition: stable Instructions: call for decreased FM, HA, blurred vision, contractions, loss of amniotic fluid, vb Discharge to: home   Newborn Data: This patient has no babies on file. Home with na.  Laura Robinson G 01/22/2012, 8:57 AM

## 2012-01-28 ENCOUNTER — Encounter (HOSPITAL_COMMUNITY): Payer: Self-pay | Admitting: *Deleted

## 2012-01-28 ENCOUNTER — Inpatient Hospital Stay (HOSPITAL_COMMUNITY): Payer: BC Managed Care – PPO

## 2012-01-28 ENCOUNTER — Inpatient Hospital Stay (HOSPITAL_COMMUNITY)
Admission: AD | Admit: 2012-01-28 | Discharge: 2012-01-31 | DRG: 886 | Disposition: A | Discharge status: Home / Self Care | Payer: BC Managed Care – PPO | Source: Ambulatory Visit | Attending: Obstetrics and Gynecology | Admitting: Obstetrics and Gynecology

## 2012-01-28 DIAGNOSIS — O1490 Unspecified pre-eclampsia, unspecified trimester: Secondary | ICD-10-CM

## 2012-01-28 DIAGNOSIS — O26839 Pregnancy related renal disease, unspecified trimester: Secondary | ICD-10-CM | POA: Diagnosis present

## 2012-01-28 DIAGNOSIS — O139 Gestational [pregnancy-induced] hypertension without significant proteinuria, unspecified trimester: Secondary | ICD-10-CM

## 2012-01-28 DIAGNOSIS — N289 Disorder of kidney and ureter, unspecified: Secondary | ICD-10-CM | POA: Diagnosis present

## 2012-01-28 DIAGNOSIS — O99810 Abnormal glucose complicating pregnancy: Principal | ICD-10-CM | POA: Diagnosis present

## 2012-01-28 LAB — COMPREHENSIVE METABOLIC PANEL
ALT: 42 U/L — ABNORMAL HIGH (ref 0–35)
AST: 26 U/L (ref 0–37)
Albumin: 2.5 g/dL — ABNORMAL LOW (ref 3.5–5.2)
Alkaline Phosphatase: 103 U/L (ref 39–117)
BUN: 29 mg/dL — ABNORMAL HIGH (ref 6–23)
CO2: 17 mEq/L — ABNORMAL LOW (ref 19–32)
Calcium: 9.4 mg/dL (ref 8.4–10.5)
Chloride: 103 mEq/L (ref 96–112)
Creatinine, Ser: 1.59 mg/dL — ABNORMAL HIGH (ref 0.50–1.10)
GFR calc Af Amer: 52 mL/min — ABNORMAL LOW (ref >90)
GFR calc non Af Amer: 45 mL/min — ABNORMAL LOW (ref >90)
Glucose, Bld: 85 mg/dL (ref 70–99)
Potassium: 4.3 mEq/L (ref 3.5–5.1)
Sodium: 132 mEq/L — ABNORMAL LOW (ref 135–145)
Total Bilirubin: 0.1 mg/dL — ABNORMAL LOW (ref 0.3–1.2)
Total Protein: 6.4 g/dL (ref 6.0–8.3)

## 2012-01-28 LAB — URINALYSIS, ROUTINE W REFLEX MICROSCOPIC
Glucose, UA: NEGATIVE mg/dL
Specific Gravity, Urine: 1.025 (ref 1.005–1.030)
pH: 6 (ref 5.0–8.0)

## 2012-01-28 LAB — CBC
HCT: 37.3 % (ref 36.0–46.0)
Hemoglobin: 12.2 g/dL (ref 12.0–15.0)
MCV: 86.7 fL (ref 78.0–100.0)
RBC: 4.3 MIL/uL (ref 3.87–5.11)
WBC: 13.2 10*3/uL — ABNORMAL HIGH (ref 4.0–10.5)

## 2012-01-28 LAB — URINE MICROSCOPIC-ADD ON

## 2012-01-28 MED ORDER — PROGESTERONE MICRONIZED 200 MG PO CAPS
200.0000 mg | ORAL_CAPSULE | Freq: Every day | ORAL | Status: DC
  Administered 2012-01-29 – 2012-01-30 (×2): 200 mg via VAGINAL
  Filled 2012-01-28 (×3): qty 1

## 2012-01-28 MED ORDER — PRENATAL MULTIVITAMIN CH
1.0000 | ORAL_TABLET | Freq: Every day | ORAL | Status: DC
  Administered 2012-01-29 – 2012-01-30 (×2): 1 via ORAL
  Filled 2012-01-28 (×2): qty 1

## 2012-01-28 MED ORDER — NITROFURANTOIN MONOHYD MACRO 100 MG PO CAPS
100.0000 mg | ORAL_CAPSULE | Freq: Two times a day (BID) | ORAL | Status: DC
  Administered 2012-01-29 – 2012-01-30 (×5): 100 mg via ORAL
  Filled 2012-01-28 (×6): qty 1

## 2012-01-28 MED ORDER — CALCIUM CARBONATE ANTACID 500 MG PO CHEW: 2.0000 | CHEWABLE_TABLET | ORAL | Status: DC | PRN

## 2012-01-28 MED ORDER — ACETAMINOPHEN 325 MG PO TABS: 650.0000 mg | ORAL_TABLET | ORAL | Status: DC | PRN

## 2012-01-28 MED ORDER — ZOLPIDEM TARTRATE 10 MG PO TABS: 10.0000 mg | ORAL_TABLET | Freq: Every evening | ORAL | Status: DC | PRN

## 2012-01-28 MED ORDER — DOCUSATE SODIUM 100 MG PO CAPS
100.0000 mg | ORAL_CAPSULE | Freq: Every day | ORAL | Status: DC
  Administered 2012-01-28 – 2012-01-30 (×3): 100 mg via ORAL
  Filled 2012-01-28 (×4): qty 1

## 2012-01-28 MED ORDER — SODIUM CHLORIDE 0.9 % IJ SOLN
3.0000 mL | Freq: Two times a day (BID) | INTRAMUSCULAR | Status: DC
  Administered 2012-01-28 – 2012-01-30 (×5): 3 mL via INTRAVENOUS

## 2012-01-28 NOTE — Progress Notes (Signed)
Pt ed. Done.  Rationale given for 24 hour urine and creatinine clearance r/t maternal and fetal well being

## 2012-01-28 NOTE — MAU Provider Note (Signed)
Laura Robinson is a 26 y.o. female G1 @ [redacted]w[redacted]d gestation who presents to MAU for PIH labs. She was evaluated in the office and Dr. Renaldo Fiddler placed orders. Medical screening exam complete and the patient is stable to await further evaluation by Dr. Renaldo Fiddler.  BP 125/81  Pulse 79  Temp(Src) 98.3 F (36.8 C) (Oral)  Resp 16  Ht 5\' 2"  (1.575 m)  Wt 220 lb 3.2 oz (99.882 kg)  BMI 40.28 kg/m2  SpO2 100%  Results for orders placed during the hospital encounter of 01/28/12 (from the past 24 hour(s))  URINALYSIS, ROUTINE W REFLEX MICROSCOPIC     Status: Abnormal   Collection Time   01/28/12  3:52 PM      Component Value Range   Color, Urine YELLOW  YELLOW    APPearance HAZY (*) CLEAR    Specific Gravity, Urine 1.025  1.005 - 1.030    pH 6.0  5.0 - 8.0    Glucose, UA NEGATIVE  NEGATIVE (mg/dL)   Hgb urine dipstick TRACE (*) NEGATIVE    Bilirubin Urine NEGATIVE  NEGATIVE    Ketones, ur NEGATIVE  NEGATIVE (mg/dL)   Protein, ur 409 (*) NEGATIVE (mg/dL)   Urobilinogen, UA 0.2  0.0 - 1.0 (mg/dL)   Nitrite NEGATIVE  NEGATIVE    Leukocytes, UA SMALL (*) NEGATIVE   URINE MICROSCOPIC-ADD ON     Status: Abnormal   Collection Time   01/28/12  3:52 PM      Component Value Range   Squamous Epithelial / LPF MANY (*) RARE    WBC, UA 7-10  <3 (WBC/hpf)   Bacteria, UA FEW (*) RARE   COMPREHENSIVE METABOLIC PANEL     Status: Abnormal   Collection Time   01/28/12  4:01 PM      Component Value Range   Sodium 132 (*) 135 - 145 (mEq/L)   Potassium 4.3  3.5 - 5.1 (mEq/L)   Chloride 103  96 - 112 (mEq/L)   CO2 17 (*) 19 - 32 (mEq/L)   Glucose, Bld 85  70 - 99 (mg/dL)   BUN 29 (*) 6 - 23 (mg/dL)   Creatinine, Ser 8.11 (*) 0.50 - 1.10 (mg/dL)   Calcium 9.4  8.4 - 91.4 (mg/dL)   Total Protein 6.4  6.0 - 8.3 (g/dL)   Albumin 2.5 (*) 3.5 - 5.2 (g/dL)   AST 26  0 - 37 (U/L)   ALT 42 (*) 0 - 35 (U/L)   Alkaline Phosphatase 103  39 - 117 (U/L)   Total Bilirubin 0.1 (*) 0.3 - 1.2 (mg/dL)   GFR calc non Af Amer 45  (*) >90 (mL/min)   GFR calc Af Amer 52 (*) >90 (mL/min)  CBC     Status: Abnormal   Collection Time   01/28/12  4:01 PM      Component Value Range   WBC 13.2 (*) 4.0 - 10.5 (K/uL)   RBC 4.30  3.87 - 5.11 (MIL/uL)   Hemoglobin 12.2  12.0 - 15.0 (g/dL)   HCT 78.2  95.6 - 21.3 (%)   MCV 86.7  78.0 - 100.0 (fL)   MCH 28.4  26.0 - 34.0 (pg)   MCHC 32.7  30.0 - 36.0 (g/dL)   RDW 08.6  57.8 - 46.9 (%)   Platelets 305  150 - 400 (K/uL)  URIC ACID     Status: Abnormal   Collection Time   01/28/12  4:01 PM      Component Value Range  Uric Acid, Serum 10.2 (*) 2.4 - 7.0 (mg/dL)

## 2012-01-28 NOTE — Progress Notes (Signed)
Pt. Has a knot on her forehead - 1 in above right eye, size of a quarter. Per patient - bumped into a pole in the first grade.  Also per patient-  The "pea size" red spots on her left lower forearm are birthmarks.

## 2012-01-28 NOTE — MAU Note (Signed)
Patient states she had blood drawn in the office yesterday. Was called today and told to come to MAU for more blood work and evaluation. Patient states she has been having some headaches off and on, none now. Denies any bleeding, contractions or leaking and reports good fetal movement.

## 2012-01-28 NOTE — MAU Note (Signed)
Pt sent over for pih eval.  States she had blood work drawn yesterday and was told to come in for repeat labs.  Reports mild headache since last night.  Denies any dizziness or blurred vision.  Reports slight sharp pains in ruq.  Denies any bleeding or lof.  + FM.

## 2012-01-28 NOTE — H&P (Signed)
26 yo G2P0 admitted today for pre-e evaluation.  Pt has had normal BP throughout pregnancy w/ significant and worsening proteinuria.  Hospitalized last week w/ 24 hr urine 4450 gms, elevated Cr.  Returned to office yesterday where repeat labs again show proteinuria, elevated Cr & uric acid but LFT also increased.  Pt received BMZ last week.  BP today 118-120s/80s.  Pt denies ctx, vb and lof.  + FM  AF, VSS Gen - NAD Abd - gravid, NT    A/P:  29 wks with proteinuria and normal BP, now with elevated LFT.  Plan to readmit for MFM eval with [place], repeat 24 hr urine and fetal monitoring.

## 2012-01-29 ENCOUNTER — Inpatient Hospital Stay (HOSPITAL_COMMUNITY): Payer: BC Managed Care – PPO

## 2012-01-29 LAB — COMPREHENSIVE METABOLIC PANEL
ALT: 39 U/L — ABNORMAL HIGH (ref 0–35)
Calcium: 9.2 mg/dL (ref 8.4–10.5)
Creatinine, Ser: 1.45 mg/dL — ABNORMAL HIGH (ref 0.50–1.10)
GFR calc Af Amer: 58 mL/min — ABNORMAL LOW (ref >90)
Glucose, Bld: 88 mg/dL (ref 70–99)
Sodium: 136 mEq/L (ref 135–145)
Total Protein: 5.8 g/dL — ABNORMAL LOW (ref 6.0–8.3)

## 2012-01-29 LAB — PROTEIN, URINE, 24 HOUR
Collection Interval-UPROT: 24 hours
Protein, 24H Urine: 4525 mg/d — ABNORMAL HIGH (ref 50–100)
Protein, Urine: 181 mg/dL

## 2012-01-29 LAB — GLUCOSE, CAPILLARY
Glucose-Capillary: 91 mg/dL (ref 70–99)
Glucose-Capillary: 103 mg/dL — ABNORMAL HIGH (ref 70–99)

## 2012-01-29 LAB — CREATININE CLEARANCE, URINE, 24 HOUR
Creatinine Clearance: 81 mL/min (ref 75–115)
Creatinine, Urine: 67.78 mg/dL
Creatinine: 1.45 mg/dL — ABNORMAL HIGH (ref 0.50–1.10)

## 2012-01-29 LAB — CBC
MCH: 28.8 pg (ref 26.0–34.0)
MCHC: 33.1 g/dL (ref 30.0–36.0)
Platelets: 275 10*3/uL (ref 150–400)

## 2012-01-29 NOTE — Progress Notes (Signed)
26 year old G 2 P 0010 at 43 w 5 days admitted yesterday with persistent proteinuria and slightly elevated ALT. This am, the patient feels perfectly fine. Reports good fetal movement.  Afebrile Blood pressure has always been normotensive Edema is not noted.  PIH labs this am reveal ALT is down to 39, Serum creatinine is 1.45 (slightly down from yesterday) and platelets are normal  IMPRESSION: IUP at 29 w 5 days  Severe proteinuria- most likely secondary to primary renal process such as nephrotic syndrome Doubt preeclampsia  PLAN: 24 hour urine collection will be complete this evening  Repeat PIH labs in the am BPP today Will change diet to 1500 mg / day sodium restricted diet Would not deliver solely based on proteinuria but nephrology consult last week recommends 24 hour urine 12 weeks post partum

## 2012-01-29 NOTE — Progress Notes (Signed)
UR Chart review completed.  

## 2012-01-29 NOTE — Progress Notes (Signed)
To MFM 

## 2012-01-29 NOTE — Progress Notes (Signed)
MFM note  Laura Robinson is a 26 yo G2P0010 currently at 54 5/7 weeks - readmitted last night due to worsening serum creatinine, elevated uric acid and minimally elevated LFTs.  The patient was recently hospitalized due to new onset nephrotic range proteinuria (4.7 g protein on 24 hr urine on 5/17).  Her blood pressures and LFTs were normal at that time.  She was seen by Nephrology - findings felt to be likely due to a primary renal disorder rather than preeclampsia and they recommended renal biopsy post partum. She completed a course of betamethasone previously.  Since admission, she has been normotensive.  The fetus is active.  She denies HA, RUQ pain or visual changes.  Last ultrasound for growth 5/12 showed an EFW of 1160 g (71st percentile) with normal amniotic fluid volume.  PMH- neg  PSH- neg  Meds - PNV, Phenergan  Social - non drinker, non smoker  Labs: Serum creat 1.45 (1.59 on admission), uric acid 10.2 (was 7.7 during last admission), AST 27, ALT 39 (was 42 on admission) Plts 275  Ultrasound today: Single IUP at 29 5/7 weeks Active fetus with BPP of 8/8 Normal amniotic fluid volume  Impression/ Plan: 1) Elevated serum creatinine/ decrease Cr clearance - likely primary renal disorder, but now with minimally elevated LFTs and elevated uric acid, cannot rule out early HELLP or preeclampsia.  Concur with inpatient observation - would recommend checking daily or every other day chemistries/ LFTs to determine which way they trend.  Would move toward delivery for elevated LFTs (>2x normal) or thrombocytopenia (<100).  While hospitalized, would also recommend weekly AFI / umbilical artery Doppler studies.  Given the patient's worsening serum creatinine, it may be reasonable to contact nephrology to determine if they have anything else to offer.  Would also consider adding pneumatic compression hose or Lovinox (prophylactic dose) for DVT prophylaxis - patients with nephrotic syndrome appear to be  at higher risk for thromboembolism.  Thank you for this kind referral.  Please contact our office if we can be of any further assistance.  Alpha Gula, MD  I spent approximately 15 minutes with this patient with over 50% of time spent in face-to-face counseling

## 2012-01-30 LAB — COMPREHENSIVE METABOLIC PANEL
Albumin: 2.4 g/dL — ABNORMAL LOW (ref 3.5–5.2)
Alkaline Phosphatase: 104 U/L (ref 39–117)
BUN: 27 mg/dL — ABNORMAL HIGH (ref 6–23)
Calcium: 9.1 mg/dL (ref 8.4–10.5)
GFR calc Af Amer: 58 mL/min — ABNORMAL LOW (ref >90)
Glucose, Bld: 84 mg/dL (ref 70–99)
Potassium: 3.8 mEq/L (ref 3.5–5.1)
Total Protein: 6.1 g/dL (ref 6.0–8.3)

## 2012-01-30 LAB — CBC
HCT: 36.4 % (ref 36.0–46.0)
Hemoglobin: 11.9 g/dL — ABNORMAL LOW (ref 12.0–15.0)
MCH: 28.4 pg (ref 26.0–34.0)
MCHC: 32.7 g/dL (ref 30.0–36.0)
MCV: 86.9 fL (ref 78.0–100.0)
Platelets: 270 10*3/uL (ref 150–400)
RBC: 4.19 MIL/uL (ref 3.87–5.11)
RDW: 15.4 % (ref 11.5–15.5)
WBC: 13.5 10*3/uL — ABNORMAL HIGH (ref 4.0–10.5)

## 2012-01-30 LAB — GLUCOSE, CAPILLARY
Glucose-Capillary: 91 mg/dL (ref 70–99)
Glucose-Capillary: 99 mg/dL (ref 70–99)

## 2012-01-30 NOTE — Progress Notes (Signed)
Patient ID: Laura Robinson, female   DOB: 02-19-1986, 26 y.o.   MRN: [id number] Pt without complaints VSSAF Labs Stable  BPS stable DTRs 2/4  Renal Insuffiency - stable No evidence of Preeclampsia Repeat labs tomorrow Consider outpt management if stable DL

## 2012-01-30 NOTE — Progress Notes (Signed)
29.[redacted]  weeks gestation, with renal insufficiency, GDM.  Height  62" Weight 220 Lbs pre-pregnancy weight 202 Lbs.Pre-pregnancy  BMI 36.9 ( class II obesity)  IBW 110 Lbs  Total weight gain 18 Lbs. Weight gain goals 11-20 Lbs.   Estimated needs: 18-2000 kcal/day, 66-76 grams protein/day, 2.1 liters fluid/day Carb mod gestational/  Low sodium diet tolerated well, appetite good. Current diet prescription will provide for increased needs. CBG (last 3)   Basename 01/30/12 1523 01/30/12 1044 01/30/12 0616  GLUCAP 95 99 91     l Nutrition Dx: Increased nutrient needs r/t fetal growth requirements and pregnancy  aeb [redacted] weeks gestation  Copy of low sodium diet given to patient. This contains foods lists of foods to avoid on a low sodium diet.  Diet education of GDM diet completed previous admission

## 2012-01-31 LAB — COMPREHENSIVE METABOLIC PANEL
Albumin: 2.3 g/dL — ABNORMAL LOW (ref 3.5–5.2)
Alkaline Phosphatase: 116 U/L (ref 39–117)
BUN: 26 mg/dL — ABNORMAL HIGH (ref 6–23)
Calcium: 9.3 mg/dL (ref 8.4–10.5)
Creatinine, Ser: 1.52 mg/dL — ABNORMAL HIGH (ref 0.50–1.10)
GFR calc Af Amer: 55 mL/min — ABNORMAL LOW (ref >90)
Glucose, Bld: 84 mg/dL (ref 70–99)
Potassium: 4.1 mEq/L (ref 3.5–5.1)
Total Protein: 5.5 g/dL — ABNORMAL LOW (ref 6.0–8.3)

## 2012-01-31 LAB — CBC
HCT: 35.5 % — ABNORMAL LOW (ref 36.0–46.0)
Hemoglobin: 12.1 g/dL (ref 12.0–15.0)
MCH: 29.1 pg (ref 26.0–34.0)
MCHC: 34.1 g/dL (ref 30.0–36.0)
MCV: 85.3 fL (ref 78.0–100.0)
RDW: 15.6 % — ABNORMAL HIGH (ref 11.5–15.5)

## 2012-01-31 LAB — GLUCOSE, CAPILLARY: Glucose-Capillary: 96 mg/dL (ref 70–99)

## 2012-01-31 LAB — URIC ACID: Uric Acid, Serum: 9.7 mg/dL — ABNORMAL HIGH (ref 2.4–7.0)

## 2012-01-31 LAB — LACTATE DEHYDROGENASE: LDH: 127 U/L (ref 94–250)

## 2012-01-31 NOTE — Discharge Summary (Signed)
  Patient is without complaints today  VS BPs 110-130/ 70-80s Labs Stable (ALT today 37)  Abd Gravid nt DTRs 1/4 No edema  IUP at 30 0/7 Renal Insufficiency - stable 24 h urine (4.5 grms prt).  Stable Cr No evidence of Preeclampsia - nl bps and labs Gest DM - diet controlled  Plan D/c home on BR with PIH warnings, gest DM diet To fu in 2 days (monday 6/3) for [place] (BPP) labs, and NST DL

## 2012-01-31 NOTE — Discharge Instructions (Signed)
Hypertension During Pregnancy Hypertension is also called high blood pressure. It can occur at any time in life and during pregnancy. When you have hypertension, there is extra pressure inside your blood vessels that carry blood from the heart to the rest of your body (arteries). Hypertension during pregnancy can cause problems for you and your baby. Your baby might not weigh as much as it should at birth or might be born early (premature). Very bad cases of hypertension during pregnancy can be life-threatening.  There are different types of hypertension during pregnancy.   Chronic hypertension. This happens when a woman has hypertension before pregnancy and it continues during pregnancy.   Gestational hypertension. This is when hypertension develops during pregnancy.   Preeclampsia or toxemia of pregnancy. This is a very serious type of hypertension that develops only during pregnancy. It is a disease that affects the whole body (systemic) and can be very dangerous for both mother and baby.   Gestational hypertension and preeclampsia usually go away after your baby is born. Blood pressure generally stabilizes within 6 weeks. Women who have hypertension during pregnancy have a greater chance of developing hypertension later in life or with future pregnancies. UNDERSTANDING BLOOD PRESSURE Blood pressure moves blood in your body. Sometimes, the force that moves the blood becomes too strong.  A blood pressure reading is given in 2 numbers and looks like a fraction.   The top number is called the systolic pressure. When your heart beats, it forces more blood to flow through the arteries. Pressure inside the arteries goes up.   The bottom number is the diastolic pressure. Pressure goes down between beats. That is when the heart is resting.   You may have hypertension if:   Your systolic blood pressure is above 140.   Your diastolic pressure is above 90.  RISK FACTORS Some factors make you more  likely to develop hypertension during pregnancy. Risk factors include:  Having hypertension before pregnancy.   Having hypertension during a previous pregnancy.   Being overweight.   Being older than 40.   Being pregnant with more than 1 baby (multiples).   Having diabetes or kidney problems.  SYMPTOMS Chronic and gestational hypertension may not cause symptoms. Preeclampsia has symptoms, which may include:  Increased protein in your urine. Your caregiver will check for this at every prenatal visit.   Swelling of your hands and face.   Rapid weight gain.   Headaches.   Visual changes.   Being bothered by light.   Abdominal pain, especially in the right upper area.   Chest pain.   Shortness of breath.   Increased reflexes.   Seizures. Seizures occur with a more severe form of preeclampsia, called eclampsia.  DIAGNOSIS   You may be diagnosed with hypertension during pregnancy during a regular prenatal exam. At each visit, tests may include:   Blood pressure checks.   A urine test to check for protein in your urine.   The type of hypertension you are diagnosed with depends on when you developed it. It also depends on your specific blood pressure reading.   Developing hypertension before 20 weeks of pregnancy is consistent with chronic hypertension.   Developing hypertension after 20 weeks of pregnancy is consistent with gestational hypertension.   Hypertension with increased urinary protein is diagnosed as preeclampsia.   Blood pressure measurements that stay above 160 systolic or 110 diastolic are a sign of severe preeclampsia.  TREATMENT Treatment for hypertension during pregnancy varies. Treatment depends on   the type of hypertension and how serious it is.  If you take medicine for chronic hypertension, you may need to switch medicines.   Drugs called ACE inhibitors should not be taken during pregnancy.   Low-dose aspirin may be suggested for women who have  risk factors for preeclampsia.   If you have gestational hypertension, you may need to take a blood pressure medicine that is safe during pregnancy. Your caregiver will recommend the appropriate medicine.   If you have severe preeclampsia, you may need to be in the hospital. Caregivers will watch you and the baby very closely. You also may need to take medicine (magnesium sulfate) to prevent seizures and lower blood pressure.   Sometimes an early delivery is needed. This may be the case if the condition worsens. It would be done to protect you and the baby. The only cure for preeclampsia is delivery.  HOME CARE INSTRUCTIONS  Schedule and keep all of your regular prenatal care.   Follow your caregiver's instructions for taking medicines. Tell your caregiver about all medicines you take. This includes over-the-counter medicines.   Eat as little salt as possible.   Get regular exercise.   Do not drink alcohol.   Do not use tobacco products.   Do not drink products with caffeine.   Lie on your left side when resting.   Tell your doctor if you have any preeclampsia symptoms.  SEEK IMMEDIATE MEDICAL CARE IF:  You have severe abdominal pain.   You have sudden swelling in the hands, ankles, or face.   You gain 4 pounds (1.8 kg) or more in 1 week.   You vomit repeatedly.   You have vaginal bleeding.   You do not feel the baby moving as much.   You have a headache.   You have blurred or double vision.   You have muscle twitching or spasms.   You have shortness of breath.   You have blue fingernails and lips.   You have blood in your urine.  MAKE SURE YOU:  Understand these instructions.   Will watch your condition.   Will get help right away if you are not doing well.  Document Released: 05/06/2011 Document Revised: 08/07/2011 Document Reviewed: 05/06/2011 [facility] Patient Information 2012 [place], [place].Hypertension During Pregnancy Hypertension is also called high  blood pressure. Blood pressure moves blood in your body. Sometimes, the force that moves the blood becomes too strong. When you are pregnant, this condition should be watched carefully. It can cause problems for you and your baby. HOME CARE   Make and keep all of your doctor visits.   Take medicine as told by your doctor. Tell your doctor about all medicines you take.   Eat very little salt.   Exercise regularly.   Do not drink alcohol.   Do not smoke.   Do not have drinks with caffeine.   Lie on your left side when resting.  GET HELP RIGHT AWAY IF:  You have bad belly (abdominal) pain.   You have sudden puffiness (swelling) in the hands, ankles, or face.   You gain 4 pounds (1.8 kilograms) or more in 1 week.   You throw up (vomit) repeatedly.   You have bleeding from the vagina.   You do not feel the baby moving as much.   You have a headache.   You have blurred or double vision.   You have muscle twitching or spasms.   You have shortness of breath.   You have  blue fingernails and lips.   You have blood in your pee (urine).  MAKE SURE YOU:  Understand these instructions.   Will watch your condition.   Will get help right away if you are not doing well.  Document Released: 09/20/2010 Document Revised: 08/07/2011 Document Reviewed: 04/04/2011 [facility] Patient Information 2012 [place], [place].Preeclampsia and Eclampsia Preeclampsia is a condition of high blood pressure during pregnancy. It can happen at 20 weeks or later in pregnancy. If high blood pressure occurs in the second half of pregnancy with no other symptoms, it is called gestational hypertension and goes away after the baby is born. If any of the symptoms listed below develop with gestational hypertension, it is then called preeclampsia. Eclampsia (convulsions) may follow preeclampsia. This is one of the reasons for regular prenatal checkups. Early diagnosis and treatment are very important to prevent  eclampsia. CAUSES  There is no known cause of preeclampsia/eclampsia in pregnancy. There are several known conditions that may put the pregnant woman at risk, such as:  The first pregnancy.   Having preeclampsia in a past pregnancy.   Having lasting (chronic) high blood pressure.   Having multiples (twins, triplets).   Being age 44 or older.   African American ethnic background.   Having kidney disease or diabetes.   Medical conditions such as lupus or blood diseases.   Being overweight (obese).  SYMPTOMS   High blood pressure.   Headaches.   Sudden weight gain.   Swelling of hands, face, legs, and feet.   Protein in the urine.   Feeling sick to your stomach (nauseous) and throwing up (vomiting).   Vision problems (blurred or double vision).   Numbness in the face, arms, legs, and feet.   Dizziness.   Slurred speech.   Preeclampsia can cause growth retardation in the fetus.   Separation (abruption) of the placenta.   Not enough fluid in the amniotic sac (oligohydramnios).   Sensitivity to bright lights.   Belly (abdominal) pain.  DIAGNOSIS  If protein is found in the urine in the second half of pregnancy, this is considered preeclampsia. Other symptoms mentioned above may also be present. TREATMENT  It is necessary to treat this.  Your caregiver may prescribe bed rest early in this condition. Plenty of rest and salt restriction may be all that is needed.   Medicines may be necessary to lower blood pressure if the condition does not respond to more conservative measures.   In more severe cases, hospitalization may be needed:   For treatment of blood pressure.   To control fluid retention.   To monitor the baby to see if the condition is causing harm to the baby.   Hospitalization is the best way to treat the first sign of preeclampsia. This is so the mother and baby can be watched closely and blood tests can be done effectively and correctly.   If  the condition becomes severe, it may be necessary to induce labor or to remove the infant by surgical means (cesarean section). The best cure for preeclampsia/eclampsia is to deliver the baby.  Preeclampsia and eclampsia involve risks to mother and infant. Your caregiver will discuss these risks with you. Together, you can work out the best possible approach to your problems. Make sure you keep your prenatal visits as scheduled. Not keeping appointments could result in a chronic or permanent injury, pain, disability to you, and death or injury to you or your unborn baby. If there is any problem keeping the appointment,  you must call to reschedule. HOME CARE INSTRUCTIONS   Keep your prenatal appointments and tests as scheduled.   Tell your caregiver if you have any of the above risk factors.   Get plenty of rest and sleep.   Eat a balanced diet that is low in salt, and do not add salt to your food.   Avoid stressful situations.   Only take over-the-counter and prescriptions medicines for pain, discomfort, or fever as directed by your caregiver.  SEEK IMMEDIATE MEDICAL CARE IF:   You develop severe swelling anywhere in the body. This usually occurs in the legs.   You gain 5 lb/2.3 kg or more in a week.   You develop a severe headache, dizziness, problems with your vision, or confusion.   You have abdominal pain, nausea, or vomiting.   You have a seizure.   You have trouble moving any part of your body, or you develop numbness or problems speaking.   You have bruising or abnormal bleeding from anywhere in the body.   You develop a stiff neck.   You pass out.  MAKE SURE YOU:   Understand these instructions.   Will watch your condition.   Will get help right away if you are not doing well or get worse.  Document Released: 08/15/2000 Document Revised: 08/07/2011 Document Reviewed: 03/31/2008 [facility] Patient Information 2012 [place], [place].Hypertension During  Pregnancy Hypertension is also called high blood pressure. It can occur at any time in life and during pregnancy. When you have hypertension, there is extra pressure inside your blood vessels that carry blood from the heart to the rest of your body (arteries). Hypertension during pregnancy can cause problems for you and your baby. Your baby might not weigh as much as it should at birth or might be born early (premature). Very bad cases of hypertension during pregnancy can be life-threatening.  There are different types of hypertension during pregnancy.   Chronic hypertension. This happens when a woman has hypertension before pregnancy and it continues during pregnancy.   Gestational hypertension. This is when hypertension develops during pregnancy.   Preeclampsia or toxemia of pregnancy. This is a very serious type of hypertension that develops only during pregnancy. It is a disease that affects the whole body (systemic) and can be very dangerous for both mother and baby.   Gestational hypertension and preeclampsia usually go away after your baby is born. Blood pressure generally stabilizes within 6 weeks. Women who have hypertension during pregnancy have a greater chance of developing hypertension later in life or with future pregnancies. UNDERSTANDING BLOOD PRESSURE Blood pressure moves blood in your body. Sometimes, the force that moves the blood becomes too strong.  A blood pressure reading is given in 2 numbers and looks like a fraction.   The top number is called the systolic pressure. When your heart beats, it forces more blood to flow through the arteries. Pressure inside the arteries goes up.   The bottom number is the diastolic pressure. Pressure goes down between beats. That is when the heart is resting.   You may have hypertension if:   Your systolic blood pressure is above 140.   Your diastolic pressure is above 90.  RISK FACTORS Some factors make you more likely to develop  hypertension during pregnancy. Risk factors include:  Having hypertension before pregnancy.   Having hypertension during a previous pregnancy.   Being overweight.   Being older than 40.   Being pregnant with more than 1 baby (multiples).  Having diabetes or kidney problems.  SYMPTOMS Chronic and gestational hypertension may not cause symptoms. Preeclampsia has symptoms, which may include:  Increased protein in your urine. Your caregiver will check for this at every prenatal visit.   Swelling of your hands and face.   Rapid weight gain.   Headaches.   Visual changes.   Being bothered by light.   Abdominal pain, especially in the right upper area.   Chest pain.   Shortness of breath.   Increased reflexes.   Seizures. Seizures occur with a more severe form of preeclampsia, called eclampsia.  DIAGNOSIS   You may be diagnosed with hypertension during pregnancy during a regular prenatal exam. At each visit, tests may include:   Blood pressure checks.   A urine test to check for protein in your urine.   The type of hypertension you are diagnosed with depends on when you developed it. It also depends on your specific blood pressure reading.   Developing hypertension before 20 weeks of pregnancy is consistent with chronic hypertension.   Developing hypertension after 20 weeks of pregnancy is consistent with gestational hypertension.   Hypertension with increased urinary protein is diagnosed as preeclampsia.   Blood pressure measurements that stay above 160 systolic or 110 diastolic are a sign of severe preeclampsia.  TREATMENT Treatment for hypertension during pregnancy varies. Treatment depends on the type of hypertension and how serious it is.  If you take medicine for chronic hypertension, you may need to switch medicines.   Drugs called ACE inhibitors should not be taken during pregnancy.   Low-dose aspirin may be suggested for women who have risk factors for  preeclampsia.   If you have gestational hypertension, you may need to take a blood pressure medicine that is safe during pregnancy. Your caregiver will recommend the appropriate medicine.   If you have severe preeclampsia, you may need to be in the hospital. Caregivers will watch you and the baby very closely. You also may need to take medicine (magnesium sulfate) to prevent seizures and lower blood pressure.   Sometimes an early delivery is needed. This may be the case if the condition worsens. It would be done to protect you and the baby. The only cure for preeclampsia is delivery.  HOME CARE INSTRUCTIONS  Schedule and keep all of your regular prenatal care.   Follow your caregiver's instructions for taking medicines. Tell your caregiver about all medicines you take. This includes over-the-counter medicines.   Eat as little salt as possible.   Get regular exercise.   Do not drink alcohol.   Do not use tobacco products.   Do not drink products with caffeine.   Lie on your left side when resting.   Tell your doctor if you have any preeclampsia symptoms.  SEEK IMMEDIATE MEDICAL CARE IF:  You have severe abdominal pain.   You have sudden swelling in the hands, ankles, or face.   You gain 4 pounds (1.8 kg) or more in 1 week.   You vomit repeatedly.   You have vaginal bleeding.   You do not feel the baby moving as much.   You have a headache.   You have blurred or double vision.   You have muscle twitching or spasms.   You have shortness of breath.   You have blue fingernails and lips.   You have blood in your urine.  MAKE SURE YOU:  Understand these instructions.   Will watch your condition.   Will get help right  away if you are not doing well.  Document Released: 05/06/2011 Document Revised: 08/07/2011 Document Reviewed: 05/06/2011 [facility] Patient Information 2012 [place], [place].Preeclampsia and Eclampsia Preeclampsia is a condition of high blood pressure  during pregnancy. It can happen at 20 weeks or later in pregnancy. If high blood pressure occurs in the second half of pregnancy with no other symptoms, it is called gestational hypertension and goes away after the baby is born. If any of the symptoms listed below develop with gestational hypertension, it is then called preeclampsia. Eclampsia (convulsions) may follow preeclampsia. This is one of the reasons for regular prenatal checkups. Early diagnosis and treatment are very important to prevent eclampsia. CAUSES  There is no known cause of preeclampsia/eclampsia in pregnancy. There are several known conditions that may put the pregnant woman at risk, such as:  The first pregnancy.   Having preeclampsia in a past pregnancy.   Having lasting (chronic) high blood pressure.   Having multiples (twins, triplets).   Being age 60 or older.   African American ethnic background.   Having kidney disease or diabetes.   Medical conditions such as lupus or blood diseases.   Being overweight (obese).  SYMPTOMS   High blood pressure.   Headaches.   Sudden weight gain.   Swelling of hands, face, legs, and feet.   Protein in the urine.   Feeling sick to your stomach (nauseous) and throwing up (vomiting).   Vision problems (blurred or double vision).   Numbness in the face, arms, legs, and feet.   Dizziness.   Slurred speech.   Preeclampsia can cause growth retardation in the fetus.   Separation (abruption) of the placenta.   Not enough fluid in the amniotic sac (oligohydramnios).   Sensitivity to bright lights.   Belly (abdominal) pain.  DIAGNOSIS  If protein is found in the urine in the second half of pregnancy, this is considered preeclampsia. Other symptoms mentioned above may also be present. TREATMENT  It is necessary to treat this.  Your caregiver may prescribe bed rest early in this condition. Plenty of rest and salt restriction may be all that is needed.    Medicines may be necessary to lower blood pressure if the condition does not respond to more conservative measures.   In more severe cases, hospitalization may be needed:   For treatment of blood pressure.   To control fluid retention.   To monitor the baby to see if the condition is causing harm to the baby.   Hospitalization is the best way to treat the first sign of preeclampsia. This is so the mother and baby can be watched closely and blood tests can be done effectively and correctly.   If the condition becomes severe, it may be necessary to induce labor or to remove the infant by surgical means (cesarean section). The best cure for preeclampsia/eclampsia is to deliver the baby.  Preeclampsia and eclampsia involve risks to mother and infant. Your caregiver will discuss these risks with you. Together, you can work out the best possible approach to your problems. Make sure you keep your prenatal visits as scheduled. Not keeping appointments could result in a chronic or permanent injury, pain, disability to you, and death or injury to you or your unborn baby. If there is any problem keeping the appointment, you must call to reschedule. HOME CARE INSTRUCTIONS   Keep your prenatal appointments and tests as scheduled.   Tell your caregiver if you have any of the above risk factors.  Get plenty of rest and sleep.   Eat a balanced diet that is low in salt, and do not add salt to your food.   Avoid stressful situations.   Only take over-the-counter and prescriptions medicines for pain, discomfort, or fever as directed by your caregiver.  SEEK IMMEDIATE MEDICAL CARE IF:   You develop severe swelling anywhere in the body. This usually occurs in the legs.   You gain 5 lb/2.3 kg or more in a week.   You develop a severe headache, dizziness, problems with your vision, or confusion.   You have abdominal pain, nausea, or vomiting.   You have a seizure.   You have trouble moving any  part of your body, or you develop numbness or problems speaking.   You have bruising or abnormal bleeding from anywhere in the body.   You develop a stiff neck.   You pass out.  MAKE SURE YOU:   Understand these instructions.   Will watch your condition.   Will get help right away if you are not doing well or get worse.  Document Released: 08/15/2000 Document Revised: 08/07/2011 Document Reviewed: 03/31/2008 [facility] Patient Information 2012 [place], [place].Arterial Hypertension Arterial hypertension (high blood pressure) is a condition of elevated pressure in your blood vessels. Hypertension over a long period of time is a risk factor for strokes, heart attacks, and heart failure. It is also the leading cause of kidney (renal) failure.  CAUSES  In Adults -- Over 90% of all hypertension has no known cause. This is called essential or primary hypertension. In the other 10% of people with hypertension, the increase in blood pressure is caused by another disorder. This is called secondary hypertension. Important causes of secondary hypertension are:  Heavy alcohol use.  Obstructive sleep apnea.  Hyperaldosterosim (Conn's syndrome).  Steroid use.  Chronic kidney failure.  Hyperparathyroidism.  Medications.  Renal artery stenosis.  Pheochromocytoma.  Cushing's disease.  Coarctation of the aorta.  Scleroderma renal crisis.  Licorice (in excessive amounts).  Drugs (cocaine, methamphetamine).  Your caregiver can explain any items above that apply to you. In Children -- Secondary hypertension is more common and should always be considered.  Pregnancy -- Few women of childbearing age have high blood pressure. However, up to 10% of them develop hypertension of pregnancy. Generally, this will not harm the woman. It may be a sign of 3 complications of pregnancy: preeclampsia, HELLP syndrome, and eclampsia. Follow up and control with medication is necessary.  SYMPTOMS  This condition normally  does not produce any noticeable symptoms. It is usually found during a routine exam.  Malignant hypertension is a late problem of high blood pressure. It may have the following symptoms:  Headaches.  Blurred vision.  End-organ damage (this means your kidneys, heart, lungs, and other organs are being damaged).  Stressful situations can increase the blood pressure. If a person with normal blood pressure has their blood pressure go up while being seen by their caregiver, this is often termed "white coat hypertension." Its importance is not known. It may be related with eventually developing hypertension or complications of hypertension.  Hypertension is often confused with mental tension, stress, and anxiety.  DIAGNOSIS  The diagnosis is made by 3 separate blood pressure measurements. They are taken at least 1 week apart from each other. If there is organ damage from hypertension, the diagnosis may be made without repeat measurements. Hypertension is usually identified by having blood pressure readings: Above 140/90 mmHg measured in both arms, at  3 separate times, over a couple weeks.  Over 130/80 mmHg should be considered a risk factor and may require treatment in patients with diabetes.  Blood pressure readings over 120/80 mmHg are called "pre-hypertension" even in non-diabetic patients. To get a true blood pressure measurement, use the following guidelines. Be aware of the factors that can alter blood pressure readings. Take measurements at least 1 hour after caffeine.  Take measurements 30 minutes after smoking and without any stress. This is another reason to quit smoking - it raises your blood pressure.  Use a proper cuff size. Ask your caregiver if you are not sure about your cuff size.  Most home blood pressure cuffs are automatic. They will measure systolic and diastolic pressures. The systolic pressure is the pressure reading at the start of sounds. Diastolic pressure is the pressure at which  the sounds disappear. If you are elderly, measure pressures in multiple postures. Try sitting, lying or standing.  Sit at rest for a minimum of 5 minutes before taking measurements.  You should not be on any medications like decongestants. These are found in many cold medications.  Record your blood pressure readings and review them with your caregiver.  If you have hypertension: Your caregiver may do tests to be sure you do not have secondary hypertension (see "causes" above).  Your caregiver may also look for signs of metabolic syndrome. This is also called Syndrome X or Insulin Resistance Syndrome. You may have this syndrome if you have type 2 diabetes, abdominal obesity, and abnormal blood lipids in addition to hypertension.  Your caregiver will take your medical and family history and perform a physical exam.  Diagnostic tests may include blood tests (for glucose, cholesterol, potassium, and kidney function), a urinalysis, or an EKG. Other tests may also be necessary depending on your condition.  PREVENTION  There are important lifestyle issues that you can adopt to reduce your chance of developing hypertension: Maintain a normal weight.  Limit the amount of salt (sodium) in your diet.  Exercise often.  Limit alcohol intake.  Get enough potassium in your diet. Discuss specific advice with your caregiver.  Follow a DASH diet (dietary approaches to stop hypertension). This diet is rich in fruits, vegetables, and low-fat dairy products, and avoids certain fats.  PROGNOSIS  Essential hypertension cannot be cured. Lifestyle changes and medical treatment can lower blood pressure and reduce complications. The prognosis of secondary hypertension depends on the underlying cause. Many people whose hypertension is controlled with medicine or lifestyle changes can live a normal, healthy life.  RISKS AND COMPLICATIONS  While high blood pressure alone is not an illness, it often requires treatment due to  its short- and long-term effects on many organs. Hypertension increases your risk for: CVAs or strokes (cerebrovascular accident).  Heart failure due to chronically high blood pressure (hypertensive cardiomyopathy).  Heart attack (myocardial infarction).  Damage to the retina (hypertensive retinopathy).  Kidney failure (hypertensive nephropathy).  Your caregiver can explain list items above that apply to you. Treatment of hypertension can significantly reduce the risk of complications. TREATMENT  For overweight patients, weight loss and regular exercise are recommended. Physical fitness lowers blood pressure.  Mild hypertension is usually treated with diet and exercise. A diet rich in fruits and vegetables, fat-free dairy products, and foods low in fat and salt (sodium) can help lower blood pressure. Decreasing salt intake decreases blood pressure in a 1/3 of people.  Stop smoking if you are a smoker.  The steps above  are highly effective in reducing blood pressure. While these actions are easy to suggest, they are difficult to achieve. Most patients with moderate or severe hypertension end up requiring medications to bring their blood pressure down to a normal level. There are several classes of medications for treatment. Blood pressure pills (antihypertensives) will lower blood pressure by their different actions. Lowering the blood pressure by 10 mmHg may decrease the risk of complications by as much as 25%. The goal of treatment is effective blood pressure control. This will reduce your risk for complications. Your caregiver will help you determine the best treatment for you according to your lifestyle. What is excellent treatment for one person, may not be for you. HOME CARE INSTRUCTIONS  Do not smoke.  Follow the lifestyle changes outlined in the "Prevention" section.  If you are on medications, follow the directions carefully. Blood pressure medications must be taken as prescribed. Skipping  doses reduces their benefit. It also puts you at risk for problems.  Follow up with your caregiver, as directed.  If you are asked to monitor your blood pressure at home, follow the guidelines in the "Diagnosis" section above.  SEEK MEDICAL CARE IF:  You think you are having medication side effects.  You have recurrent headaches or lightheadedness.  You have swelling in your ankles.  You have trouble with your vision.  SEEK IMMEDIATE MEDICAL CARE IF:  You have sudden onset of chest pain or pressure, difficulty breathing, or other symptoms of a heart attack.  You have a severe headache.  You have symptoms of a stroke (such as sudden weakness, difficulty speaking, difficulty walking).  MAKE SURE YOU:  Understand these instructions.  Will watch your condition.  Will get help right away if you are not doing well or get worse.  Document Released: 08/18/2005 Document Revised: 08/07/2011 Document Reviewed: 03/18/2007 [employer] Patient Information 2012 [place], [place].

## 2012-02-01 LAB — CULTURE, BETA STREP (GROUP B ONLY)

## 2012-02-21 ENCOUNTER — Inpatient Hospital Stay (HOSPITAL_COMMUNITY)
Admission: AD | Admit: 2012-02-21 | Discharge: 2012-02-24 | DRG: 774 | Disposition: A | Discharge status: Home / Self Care | Payer: PRIVATE HEALTH INSURANCE | Source: Ambulatory Visit | Attending: Obstetrics and Gynecology | Admitting: Obstetrics and Gynecology

## 2012-02-21 ENCOUNTER — Encounter (HOSPITAL_COMMUNITY): Payer: Self-pay | Admitting: *Deleted

## 2012-02-21 ENCOUNTER — Encounter (HOSPITAL_COMMUNITY): Payer: Self-pay | Admitting: Anesthesiology

## 2012-02-21 ENCOUNTER — Inpatient Hospital Stay (HOSPITAL_COMMUNITY): Payer: PRIVATE HEALTH INSURANCE | Admitting: Anesthesiology

## 2012-02-21 DIAGNOSIS — I158 Other secondary hypertension: Secondary | ICD-10-CM | POA: Diagnosis present

## 2012-02-21 DIAGNOSIS — I151 Hypertension secondary to other renal disorders: Principal | ICD-10-CM | POA: Diagnosis present

## 2012-02-21 DIAGNOSIS — N289 Disorder of kidney and ureter, unspecified: Secondary | ICD-10-CM | POA: Diagnosis present

## 2012-02-21 DIAGNOSIS — E871 Hypo-osmolality and hyponatremia: Secondary | ICD-10-CM | POA: Diagnosis present

## 2012-02-21 HISTORY — DX: Gestational diabetes mellitus in childbirth, diet controlled: O24.420

## 2012-02-21 LAB — COMPREHENSIVE METABOLIC PANEL
ALT: 20 U/L (ref 0–35)
Alkaline Phosphatase: 127 U/L — ABNORMAL HIGH (ref 39–117)
CO2: 19 mEq/L (ref 19–32)
GFR calc Af Amer: 38 mL/min — ABNORMAL LOW (ref >90)
GFR calc non Af Amer: 33 mL/min — ABNORMAL LOW (ref >90)
Glucose, Bld: 85 mg/dL (ref 70–99)
Potassium: 4.9 mEq/L (ref 3.5–5.1)
Sodium: 139 mEq/L (ref 135–145)
Total Bilirubin: 0.1 mg/dL — ABNORMAL LOW (ref 0.3–1.2)

## 2012-02-21 LAB — CBC
HCT: 37.7 % (ref 36.0–46.0)
Hemoglobin: 12.6 g/dL (ref 12.0–15.0)
Hemoglobin: 12.6 g/dL (ref 12.0–15.0)
MCH: 29 pg (ref 26.0–34.0)
MCHC: 33.7 g/dL (ref 30.0–36.0)
Platelets: 241 10*3/uL (ref 150–400)
RBC: 4.34 MIL/uL (ref 3.87–5.11)
RBC: 4.33 MIL/uL (ref 3.87–5.11)

## 2012-02-21 LAB — URINALYSIS, ROUTINE W REFLEX MICROSCOPIC
Bilirubin Urine: NEGATIVE
Ketones, ur: NEGATIVE mg/dL
Nitrite: NEGATIVE
Protein, ur: >300 mg/dL — AB
pH: 6.5 (ref 5.0–8.0)

## 2012-02-21 LAB — URINE MICROSCOPIC-ADD ON

## 2012-02-21 LAB — TYPE AND SCREEN
ABO/RH(D): O POS
Antibody Screen: NEGATIVE

## 2012-02-21 MED ORDER — EPHEDRINE 5 MG/ML INJ: 10.0000 mg | INTRAVENOUS | Status: DC | PRN

## 2012-02-21 MED ORDER — PHENYLEPHRINE 40 MCG/ML (10ML) SYRINGE FOR IV PUSH (FOR BLOOD PRESSURE SUPPORT): 80.0000 ug | PREFILLED_SYRINGE | INTRAVENOUS | Status: DC | PRN

## 2012-02-21 MED ORDER — CITRIC ACID-SODIUM CITRATE 334-500 MG/5ML PO SOLN
30.0000 mL | ORAL | Status: DC | PRN
  Filled 2012-02-21: qty 15

## 2012-02-21 MED ORDER — EPHEDRINE 5 MG/ML INJ
10.0000 mg | INTRAVENOUS | Status: DC | PRN
  Filled 2012-02-21: qty 4

## 2012-02-21 MED ORDER — DOCUSATE SODIUM 100 MG PO CAPS: 100.0000 mg | ORAL_CAPSULE | Freq: Every day | ORAL | Status: DC

## 2012-02-21 MED ORDER — IBUPROFEN 600 MG PO TABS: 600.0000 mg | ORAL_TABLET | Freq: Four times a day (QID) | ORAL | Status: DC | PRN

## 2012-02-21 MED ORDER — OXYTOCIN 40 UNITS IN LACTATED RINGERS INFUSION - SIMPLE MED
62.5000 mL/h | Freq: Once | INTRAVENOUS | Status: AC | PRN
  Administered 2012-02-21: 2.5 [IU]/h via INTRAVENOUS

## 2012-02-21 MED ORDER — LACTATED RINGERS IV SOLN
INTRAVENOUS | Status: DC
  Administered 2012-02-22: 13:00:00 via INTRAVENOUS

## 2012-02-21 MED ORDER — OXYTOCIN 40 UNITS IN LACTATED RINGERS INFUSION - SIMPLE MED: 62.5000 mL/h | Freq: Once | INTRAVENOUS | Status: DC

## 2012-02-21 MED ORDER — PENICILLIN G POTASSIUM 5000000 UNITS IJ SOLR
5.0000 10*6.[IU] | Freq: Once | INTRAVENOUS | Status: AC
  Administered 2012-02-21: 5 10*6.[IU] via INTRAVENOUS
  Filled 2012-02-21: qty 5

## 2012-02-21 MED ORDER — SODIUM BICARBONATE 8.4 % IV SOLN
INTRAVENOUS | Status: DC | PRN
  Administered 2012-02-21: 4 mL via EPIDURAL

## 2012-02-21 MED ORDER — LACTATED RINGERS IV SOLN: 500.0000 mL | INTRAVENOUS | Status: DC | PRN

## 2012-02-21 MED ORDER — DIPHENHYDRAMINE HCL 50 MG/ML IJ SOLN: 12.5000 mg | INTRAMUSCULAR | Status: DC | PRN

## 2012-02-21 MED ORDER — FENTANYL 2.5 MCG/ML BUPIVACAINE 1/10 % EPIDURAL INFUSION (WH - ANES): 14.0000 mL/h | INTRAMUSCULAR | Status: DC

## 2012-02-21 MED ORDER — LACTATED RINGERS IV SOLN
INTRAVENOUS | Status: DC
  Administered 2012-02-21: 17:00:00 via INTRAVENOUS

## 2012-02-21 MED ORDER — ONDANSETRON HCL 4 MG/2ML IJ SOLN: 4.0000 mg | Freq: Four times a day (QID) | INTRAMUSCULAR | Status: DC | PRN

## 2012-02-21 MED ORDER — OXYTOCIN BOLUS FROM INFUSION
250.0000 mL | Freq: Once | INTRAVENOUS | Status: DC
  Filled 2012-02-21: qty 500

## 2012-02-21 MED ORDER — PHENYLEPHRINE 40 MCG/ML (10ML) SYRINGE FOR IV PUSH (FOR BLOOD PRESSURE SUPPORT)
80.0000 ug | PREFILLED_SYRINGE | INTRAVENOUS | Status: AC | PRN
  Administered 2012-02-21 (×3): 80 ug via INTRAVENOUS
  Filled 2012-02-21: qty 10
  Filled 2012-02-21: qty 5

## 2012-02-21 MED ORDER — ZOLPIDEM TARTRATE 10 MG PO TABS: 10.0000 mg | ORAL_TABLET | Freq: Every evening | ORAL | Status: DC | PRN

## 2012-02-21 MED ORDER — OXYTOCIN BOLUS FROM INFUSION
250.0000 mL | Freq: Once | INTRAVENOUS | Status: AC | PRN
  Administered 2012-02-21: 250 mL via INTRAVENOUS
  Filled 2012-02-21: qty 500

## 2012-02-21 MED ORDER — MAGNESIUM SULFATE 40 G IN LACTATED RINGERS - SIMPLE
1.0000 g/h | Freq: Once | INTRAVENOUS | Status: AC
  Administered 2012-02-21: 1 g/h via INTRAVENOUS
  Filled 2012-02-21: qty 500

## 2012-02-21 MED ORDER — FENTANYL 2.5 MCG/ML BUPIVACAINE 1/10 % EPIDURAL INFUSION (WH - ANES)
14.0000 mL/h | INTRAMUSCULAR | Status: DC
  Administered 2012-02-21: 14 mL/h via EPIDURAL
  Filled 2012-02-21 (×2): qty 60

## 2012-02-21 MED ORDER — LACTATED RINGERS IV SOLN: 500.0000 mL | Freq: Once | INTRAVENOUS | Status: DC

## 2012-02-21 MED ORDER — PENICILLIN G POTASSIUM 5000000 UNITS IJ SOLR
2.5000 10*6.[IU] | INTRAVENOUS | Status: DC
  Administered 2012-02-21: 2.5 10*6.[IU] via INTRAVENOUS
  Filled 2012-02-21 (×6): qty 2.5

## 2012-02-21 MED ORDER — PHENYLEPHRINE 40 MCG/ML (10ML) SYRINGE FOR IV PUSH (FOR BLOOD PRESSURE SUPPORT)
80.0000 ug | PREFILLED_SYRINGE | INTRAVENOUS | Status: DC | PRN
  Administered 2012-02-21: 80 ug via INTRAVENOUS

## 2012-02-21 MED ORDER — LIDOCAINE HCL (PF) 1 % IJ SOLN
30.0000 mL | INTRAMUSCULAR | Status: DC | PRN
  Filled 2012-02-21: qty 30

## 2012-02-21 MED ORDER — OXYCODONE-ACETAMINOPHEN 5-325 MG PO TABS: 1.0000 | ORAL_TABLET | ORAL | Status: DC | PRN

## 2012-02-21 MED ORDER — ACETAMINOPHEN 325 MG PO TABS: 650.0000 mg | ORAL_TABLET | ORAL | Status: DC | PRN

## 2012-02-21 MED ORDER — PROGESTERONE MICRONIZED 200 MG PO CAPS
200.0000 mg | ORAL_CAPSULE | Freq: Every day | ORAL | Status: DC
  Filled 2012-02-21: qty 1

## 2012-02-21 MED ORDER — PRENATAL MULTIVITAMIN CH: 1.0000 | ORAL_TABLET | Freq: Every day | ORAL | Status: DC

## 2012-02-21 MED ORDER — CALCIUM CARBONATE ANTACID 500 MG PO CHEW: 2.0000 | CHEWABLE_TABLET | ORAL | Status: DC | PRN

## 2012-02-21 MED ORDER — FLEET ENEMA 7-19 GM/118ML RE ENEM: 1.0000 | ENEMA | RECTAL | Status: DC | PRN

## 2012-02-21 MED ORDER — PHENYLEPHRINE 40 MCG/ML (10ML) SYRINGE FOR IV PUSH (FOR BLOOD PRESSURE SUPPORT)
80.0000 ug | PREFILLED_SYRINGE | INTRAVENOUS | Status: AC | PRN
  Administered 2012-02-21 (×3): 80 ug via INTRAVENOUS

## 2012-02-21 MED ORDER — TERBUTALINE SULFATE 1 MG/ML IJ SOLN: 0.2500 mg | Freq: Once | INTRAMUSCULAR | Status: AC | PRN

## 2012-02-21 MED ORDER — OXYTOCIN 40 UNITS IN LACTATED RINGERS INFUSION - SIMPLE MED
1.0000 m[IU]/min | INTRAVENOUS | Status: DC
  Administered 2012-02-21: 1 m[IU]/min via INTRAVENOUS
  Filled 2012-02-21: qty 1000

## 2012-02-21 MED ORDER — FENTANYL 2.5 MCG/ML BUPIVACAINE 1/10 % EPIDURAL INFUSION (WH - ANES)
INTRAMUSCULAR | Status: DC | PRN
  Administered 2012-02-21: 13 mL/h via EPIDURAL

## 2012-02-21 NOTE — Progress Notes (Signed)
I had telephone conversation with Dr. Claudean Severance. He is familiar with the patient. Given the development of hypertension and the progression of her renal disease Recommend delivery  Cervix is 90% 3 cm - 2 Vertex Favorable for delivery  Magnesium for prophylaxis 1 gram per hour Check mag level every 6 hours Pitocin Lorelee Cover

## 2012-02-21 NOTE — Anesthesia Preprocedure Evaluation (Signed)
Anesthesia Evaluation  Patient identified by MRN, date of birth, ID band Patient awake    Reviewed: Allergy & Precautions, H&P , Patient's Chart, lab work & pertinent test results  Airway Mallampati: III TM Distance: >3 FB Neck ROM: full    Dental  (+) Teeth Intact   Pulmonary  breath sounds clear to auscultation        Cardiovascular Rhythm:regular Rate:Normal     Neuro/Psych    GI/Hepatic   Endo/Other  Morbid obesity  Renal/GU CRFRenal disease     Musculoskeletal   Abdominal   Peds  Hematology   Anesthesia Other Findings  Renal insufficiency....Marland KitchenMarland Kitchenetology unknown w/o kidney Bx Nl Plts     Reproductive/Obstetrics (+) Pregnancy                           Anesthesia Physical Anesthesia Plan  ASA: III  Anesthesia Plan: Epidural   Post-op Pain Management:    Induction:   Airway Management Planned:   Additional Equipment:   Intra-op Plan:   Post-operative Plan:   Informed Consent:   Plan Discussed with:   Anesthesia Plan Comments:         Anesthesia Quick Evaluation

## 2012-02-21 NOTE — MAU Note (Signed)
Patient is on bedrest at home

## 2012-02-21 NOTE — Anesthesia Procedure Notes (Signed)
Epidural Patient location during procedure: OB  Preanesthetic Checklist Completed: patient identified, site marked, surgical consent, pre-op evaluation, timeout performed, IV checked, risks and benefits discussed and monitors and equipment checked  Epidural Patient position: sitting Prep: site prepped and draped and DuraPrep Patient monitoring: continuous pulse ox and blood pressure Approach: midline Injection technique: LOR air  Needle:  Needle type: Tuohy  Needle gauge: 17 G Needle length: 9 cm Needle insertion depth: 7 cm Catheter type: closed end flexible Catheter size: 19 Gauge Catheter at skin depth: 15 cm Test dose: negative  Assessment Events: blood not aspirated, injection not painful, no injection resistance, negative IV test and no paresthesia  Additional Notes Dosing of Epidural:  1st dose, through needle ............................................Marland Kitchen epi 1:200K + Xylocaine 40 mg  2nd dose, through catheter, after waiting 3 minutes...Marland KitchenMarland Kitchenepi 1:200K + Xylocaine 40 mg  3rd dose, through catheter after waiting 3 minutes .............................Marcaine   4mg    ( mg Marcaine are expressed as equivilent  cc's medication removed from the 0.1%Bupiv / fentanyl syringe from L&D pump)  ( 2% Xylo charted as a single dose in Epic Meds for ease of charting; actual dosing was fractionated as above, for saftey's sake)  As each dose occurred, patient was free of IV sx; and patient exhibited no evidence of SA injection.  Patient is more comfortable after epidural dosed. Please see RN's note for documentation of vital signs,and FHR which are stable.  Patient reminded not to try to ambulate with numb legs, and that an RN must be present the 1st time she attempts to get up.

## 2012-02-21 NOTE — MAU Note (Signed)
Patient presents with c/o facial, hand and leg swelling since yesterday, patient states she "feels off" denies dizziness, no visual disturbances. Patient is [redacted] weeks gestation.

## 2012-02-21 NOTE — MAU Note (Signed)
Care assumed from [facility]  RN

## 2012-02-21 NOTE — H&P (Signed)
26 year old G 2 P 0011 at 33 weeks presents to the MAU reporting noticing increased edema in her hands, feet and face since yesterday. She also reports feeling dizzy today with a headache. Her PNC has been complicated - she was noted to have progressive proteinuria. In the first trimester, she was noted to have +1 proteinuria on urine dip. She has had several hospitalizations for the progressive proteinuria and rising serum creatinine. Last hospitalization she was evaluated by nephrology and the diagnosis was felt to be some primary nephrotic process and not primarily preeclampsia. A renal biopsy after delivery has been recommended. She has also been evaluated by perinatology and they agreed but feel that she is at increased risk for secondary preeclampsia. Her blood pressure has not been elevated until today's presentation.  BP 155/110 Afebrile General alert and oriented Lung CTAB  Car RRR Abdomen is soft and non tender Ext - edema mild noted in lower extremities  Labs : Serum creatinine is 2.05  Yesterday in the office it was 2.12 About a week ago the Creatinine was 1.75 Platelets and LFTs are normal Last 24 hour urine was 5/29 4.5 gram of protein  IMPRESSION: IUP at 33 weeks Hypertension Probably underlying renal disease  PLAN: Admit Monitor for secondary preeclampsia Consult with nephrology

## 2012-02-21 NOTE — Progress Notes (Signed)
I spoke with Dr. Arlean Hopping, Nephrology, about this patient.  He is concerned as well as me about the rising creatinine. Recommend delivery "sooner than later" based on progressive decline in renal function and now with hypertension. Dr. Arlean Hopping is going to consult on the patient today.  I am going to request perinatal consult with Dr. Claudean Severance as well about indications for early delivery.

## 2012-02-21 NOTE — Progress Notes (Signed)
Dr Sherron Ales notified of admin 2 doses of Phenylephrine and BPs related to epidural. Informed pt feels better. Dr Jean Rosenthal ordered parameters for systolic BPs>120 and diastolic BP> 70. Ordered to continue admin with phenylephrine if symptomatic and continued hypotension. Call MD if BP continue to be hypotensive after 3 syringe doses of phenylephrine with no success of maintaining BPs.

## 2012-02-21 NOTE — Progress Notes (Signed)
Dr Vincente Poli notified of pt status, FHR tracing, UCs, mat BPs, admin and amt of phenylephrine, GDM upon admission, last BS, epidural status, amt of pitocin, lack of symptoms and mat temp. Orders for every four hour CBGs.

## 2012-02-21 NOTE — MAU Note (Signed)
Dr Vincente Poli in to check cervix and discuss plan of care with patient.

## 2012-02-21 NOTE — MAU Provider Note (Signed)
History     CSN: [id number]  Arrival date and time: 02/21/12 1304   None     Chief Complaint  Patient presents with  . Facial Swelling  . Leg Swelling   HPI Laura Robinson 25 y.o. [redacted]w[redacted]d  Having continued edema since yesterday and not feeling well.  Has been having problems with blood pressure and kidney function this pregnancy.  Came for evaluation.  OB History    Grav Para Term Preterm Abortions TAB SAB Ect Mult Living   2    1 1    1       Past Medical History  Diagnosis Date  . Obesity   . Pregnancy induced hypertension   . Preterm labor     Past Surgical History  Procedure Date  . No past surgeries     Family History  Problem Relation Age of Onset  . Asthma Brother   . Hypertension Brother   . Kidney disease Brother   . Cancer Maternal Grandmother   . Hypertension Maternal Grandmother   . Anesthesia problems Neg Hx     History  Substance Use Topics  . Smoking status: Former Smoker -- 0.2 packs/day for 4 years    Types: Cigarettes    Quit date: 07/14/2011  . Smokeless tobacco: Never Used  . Alcohol Use: No     prior to pregnancy    Allergies: No Known Allergies  Prescriptions prior to admission  Medication Sig Dispense Refill  . Prenatal Vit-Fe Fumarate-FA (PRENATAL MULTIVITAMIN) TABS Take 1 tablet by mouth every morning.       . progesterone (PROMETRIUM) 200 MG capsule Take 200 mg by mouth at bedtime.        ROS Physical Exam   Blood pressure 131/78, pulse 78, temperature 98.3 F (36.8 C), temperature source Oral, resp. rate 18, height 5\' 2"  (1.575 m), weight 226 lb 9.6 oz (102.785 kg).  Physical Exam  Nursing note and vitals reviewed. Constitutional: She is oriented to person, place, and time. She appears well-developed and well-nourished.  HENT:  Head: Normocephalic.  Eyes: EOM are normal.  Neck: Neck supple.  GI: Soft. There is no tenderness.       No epigastric pain On fetal monitor.  Musculoskeletal: Normal range of motion. She  exhibits edema.       Ankles trace bilaterally. Lower legs 1-2+ bilaterally. Reports facial and hand edema.  Neurological: She is alert and oriented to person, place, and time.  Skin: Skin is warm and dry.  Psychiatric: She has a normal mood and affect.    MAU Course  Procedures Results for orders placed during the hospital encounter of 02/21/12 (from the past 24 hour(s))  URINALYSIS, ROUTINE W REFLEX MICROSCOPIC     Status: Abnormal   Collection Time   02/21/12  1:15 PM      Component Value Range   Color, Urine YELLOW  YELLOW   APPearance CLOUDY (*) CLEAR   Specific Gravity, Urine 1.020  1.005 - 1.030   pH 6.5  5.0 - 8.0   Glucose, UA NEGATIVE  NEGATIVE mg/dL   Hgb urine dipstick SMALL (*) NEGATIVE   Bilirubin Urine NEGATIVE  NEGATIVE   Ketones, ur NEGATIVE  NEGATIVE mg/dL   Protein, ur >657 (*) NEGATIVE mg/dL   Urobilinogen, UA 0.2  0.0 - 1.0 mg/dL   Nitrite NEGATIVE  NEGATIVE   Leukocytes, UA MODERATE (*) NEGATIVE  URINE MICROSCOPIC-ADD ON     Status: Abnormal   Collection Time   02/21/12  1:15 PM      Component Value Range   Squamous Epithelial / LPF FEW (*) RARE   WBC, UA 21-50  <3 WBC/hpf   RBC / HPF 3-6  <3 RBC/hpf   Bacteria, UA MANY (*) RARE  CBC     Status: Abnormal   Collection Time   02/21/12  2:14 PM      Component Value Range   WBC 12.3 (*) 4.0 - 10.5 K/uL   RBC 4.34  3.87 - 5.11 MIL/uL   Hemoglobin 12.6  12.0 - 15.0 g/dL   HCT 16.1  09.6 - 04.5 %   MCV 86.9  78.0 - 100.0 fL   MCH 29.0  26.0 - 34.0 pg   MCHC 33.4  30.0 - 36.0 g/dL   RDW 40.9 (*) 81.1 - 91.4 %   Platelets 230  150 - 400 K/uL  COMPREHENSIVE METABOLIC PANEL     Status: Abnormal   Collection Time   02/21/12  2:14 PM      Component Value Range   Sodium 139  135 - 145 mEq/L   Potassium 4.9  3.5 - 5.1 mEq/L   Chloride 108  96 - 112 mEq/L   CO2 19  19 - 32 mEq/L   Glucose, Bld 85  70 - 99 mg/dL   BUN 25 (*) 6 - 23 mg/dL   Creatinine, Ser 7.82 (*) 0.50 - 1.10 mg/dL   Calcium 9.6  8.4 -  95.6 mg/dL   Total Protein 5.0 (*) 6.0 - 8.3 g/dL   Albumin 1.9 (*) 3.5 - 5.2 g/dL   AST 17  0 - 37 U/L   ALT 20  0 - 35 U/L   Alkaline Phosphatase 127 (*) 39 - 117 U/L   Total Bilirubin 0.1 (*) 0.3 - 1.2 mg/dL   GFR calc non Af Amer 33 (*) >90 mL/min   GFR calc Af Amer 38 (*) >90 mL/min   MDM Consult with Dr. Vincente Poli who will see client in MAU.  Assessment and Plan    Laura Robinson 02/21/2012, 2:07 PM

## 2012-02-22 ENCOUNTER — Encounter (HOSPITAL_COMMUNITY): Payer: Self-pay | Admitting: Obstetrics

## 2012-02-22 LAB — CBC
HCT: 37.8 % (ref 36.0–46.0)
RDW: 15.9 % — ABNORMAL HIGH (ref 11.5–15.5)
WBC: 19.7 10*3/uL — ABNORMAL HIGH (ref 4.0–10.5)

## 2012-02-22 LAB — BASIC METABOLIC PANEL
BUN: 25 mg/dL — ABNORMAL HIGH (ref 6–23)
Chloride: 106 mEq/L (ref 96–112)
GFR calc Af Amer: 40 mL/min — ABNORMAL LOW (ref >90)
GFR calc non Af Amer: 35 mL/min — ABNORMAL LOW (ref >90)
Potassium: 4.6 mEq/L (ref 3.5–5.1)
Sodium: 134 mEq/L — ABNORMAL LOW (ref 135–145)

## 2012-02-22 LAB — MAGNESIUM: Magnesium: 4.5 mg/dL — ABNORMAL HIGH (ref 1.5–2.5)

## 2012-02-22 LAB — MRSA PCR SCREENING: MRSA by PCR: NEGATIVE

## 2012-02-22 LAB — RPR: RPR Ser Ql: NONREACTIVE

## 2012-02-22 MED ORDER — TETANUS-DIPHTH-ACELL PERTUSSIS 5-2.5-18.5 LF-MCG/0.5 IM SUSP
0.5000 mL | Freq: Once | INTRAMUSCULAR | Status: DC
  Filled 2012-02-22: qty 0.5

## 2012-02-22 MED ORDER — MEASLES, MUMPS & RUBELLA VAC ~~LOC~~ INJ
0.5000 mL | INJECTION | Freq: Once | SUBCUTANEOUS | Status: DC
  Filled 2012-02-22: qty 0.5

## 2012-02-22 MED ORDER — ONDANSETRON HCL 4 MG/2ML IJ SOLN: 4.0000 mg | INTRAMUSCULAR | Status: DC | PRN

## 2012-02-22 MED ORDER — OXYTOCIN 40 UNITS IN LACTATED RINGERS INFUSION - SIMPLE MED: 62.5000 mL/h | INTRAVENOUS | Status: AC

## 2012-02-22 MED ORDER — SENNOSIDES-DOCUSATE SODIUM 8.6-50 MG PO TABS
2.0000 | ORAL_TABLET | Freq: Every day | ORAL | Status: DC
  Administered 2012-02-22: 2 via ORAL

## 2012-02-22 MED ORDER — OXYTOCIN 40 UNITS IN LACTATED RINGERS INFUSION - SIMPLE MED
62.5000 mL/h | Freq: Once | INTRAVENOUS | Status: AC | PRN
  Administered 2012-02-22: 02:00:00 via INTRAVENOUS

## 2012-02-22 MED ORDER — PRENATAL MULTIVITAMIN CH: 1.0000 | ORAL_TABLET | Freq: Every day | ORAL | Status: DC

## 2012-02-22 MED ORDER — LABETALOL HCL 100 MG PO TABS
100.0000 mg | ORAL_TABLET | Freq: Two times a day (BID) | ORAL | Status: DC
  Administered 2012-02-22 – 2012-02-24 (×5): 100 mg via ORAL
  Filled 2012-02-22 (×7): qty 1

## 2012-02-22 MED ORDER — LANOLIN HYDROUS EX OINT: TOPICAL_OINTMENT | CUTANEOUS | Status: DC | PRN

## 2012-02-22 MED ORDER — MEDROXYPROGESTERONE ACETATE 150 MG/ML IM SUSP: 150.0000 mg | INTRAMUSCULAR | Status: DC | PRN

## 2012-02-22 MED ORDER — DIBUCAINE 1 % RE OINT: 1.0000 "application " | TOPICAL_OINTMENT | RECTAL | Status: DC | PRN

## 2012-02-22 MED ORDER — SIMETHICONE 80 MG PO CHEW: 80.0000 mg | CHEWABLE_TABLET | ORAL | Status: DC | PRN

## 2012-02-22 MED ORDER — ONDANSETRON HCL 4 MG PO TABS: 4.0000 mg | ORAL_TABLET | ORAL | Status: DC | PRN

## 2012-02-22 MED ORDER — DIPHENHYDRAMINE HCL 25 MG PO CAPS: 25.0000 mg | ORAL_CAPSULE | Freq: Four times a day (QID) | ORAL | Status: DC | PRN

## 2012-02-22 MED ORDER — FLEET ENEMA 7-19 GM/118ML RE ENEM: 1.0000 | ENEMA | Freq: Every day | RECTAL | Status: DC | PRN

## 2012-02-22 MED ORDER — MAGNESIUM SULFATE 40 G IN LACTATED RINGERS - SIMPLE
1.0000 g/h | INTRAVENOUS | Status: DC
  Filled 2012-02-22: qty 500

## 2012-02-22 MED ORDER — BISACODYL 10 MG RE SUPP: 10.0000 mg | Freq: Every day | RECTAL | Status: DC | PRN

## 2012-02-22 MED ORDER — BENZOCAINE-MENTHOL 20-0.5 % EX AERO
1.0000 "application " | INHALATION_SPRAY | CUTANEOUS | Status: DC | PRN
  Filled 2012-02-22: qty 56

## 2012-02-22 MED ORDER — OXYCODONE-ACETAMINOPHEN 5-325 MG PO TABS
1.0000 | ORAL_TABLET | ORAL | Status: DC | PRN
  Administered 2012-02-22 – 2012-02-23 (×4): 1 via ORAL
  Filled 2012-02-22 (×4): qty 1

## 2012-02-22 MED ORDER — ZOLPIDEM TARTRATE 5 MG PO TABS: 5.0000 mg | ORAL_TABLET | Freq: Every evening | ORAL | Status: DC | PRN

## 2012-02-22 MED ORDER — WITCH HAZEL-GLYCERIN EX PADS: 1.0000 "application " | MEDICATED_PAD | CUTANEOUS | Status: DC | PRN

## 2012-02-22 NOTE — Progress Notes (Signed)
Patient is doing much better. Had a diuresis of over 1500 cc last night.  Baby is stable in the NICU.  BP 140s/67 Vital signs stable Abdomen is soft and non tender Lochia wnl CBC is ok Creatinine is down to 1.95  IMPRESSION: Post partum with probably underlying renal disease and secondary preeclampsia Improving  PLAN: Continue Magnesium at 1 gram per hour Nephrology consult today Repeat labs in am and will most likely discontinue Magnesium then

## 2012-02-22 NOTE — Progress Notes (Signed)
Encouraged patient to begin pumping.  Brought her breast pump and offered to assist her with pumping.  She says that she "does not want to now-too tired".  Will attempt to assist her again later.

## 2012-02-22 NOTE — Consult Note (Signed)
Reason for Consult: Acute renal failure, hypertension, proteinuria in a peripartum woman Referring Physician: Dr. Marcelle Overlie- Obstetrics/gynecology  Laura Robinson is an 26 y.o. female.  HPI:  26 year old woman of Hispanic origin who is one day postpartum following the emergent delivery of a live female baby at 21 weeks of gestation. Emergent delivery was undertaken due to worsening proteinuria, worsening hypertension as well as declining renal function. From review of available records, early in pregnancy creatinine seem to be about 1.2 and it risen to about 1.6 last week-on the day of delivery was up to 2.0. Blood pressures also noted to have risen in the recent period. Over her pregnancy, she has had 3 separate qualifications or proteinuria in the range has been 4.0 g to 4.7 g over 24 hours. She was initially seen in consultation by Dr. Arlean Hopping about a month ago with regards to nephrotic range proteinuria and workup at that time revealed negative antinuclear antibody, elevated complement levels (not significant), negative HIV, negative HCV, negative HBV and negative RPR. Blood pressure control seemingly improved today following the administration of labetalol. Renal function essentially unchanged with a creatinine of 1.9.  Past Medical History  Diagnosis Date  . Obesity   . Pregnancy induced hypertension   . Preterm labor   . Gestational diabetes mellitus in childbirth, diet controlled     Past Surgical History  Procedure Date  . No past surgeries     Family History  Problem Relation Age of Onset  . Asthma Brother   . Hypertension Brother   . Kidney disease Brother   . Cancer Maternal Grandmother   . Hypertension Maternal Grandmother   . Anesthesia problems Neg Hx     Social History:  reports that she quit smoking about 7 months ago. Her smoking use included Cigarettes. She has a 1 pack-year smoking history. She has never used smokeless tobacco. She reports that she does not drink  alcohol or use illicit drugs.  Allergies: No Known Allergies  Medications:  Scheduled:   . labetalol  100 mg Oral BID  . magnesium sulfate 40 grams in LR 500 mL  1 g/hr Intravenous Once  . pencillin G potassium IV  5 Million Units Intravenous Once  . prenatal multivitamin  1 tablet Oral Daily  . senna-docusate  2 tablet Oral QHS  . TDaP  0.5 mL Intramuscular Once  . DISCONTD: docusate sodium  100 mg Oral Daily  . DISCONTD: lactated ringers  500 mL Intravenous Once  . DISCONTD: lactated ringers  500 mL Intravenous Once  . DISCONTD: measles, mumps and rubella vaccine  0.5 mL Subcutaneous Once  . DISCONTD: oxytocin 40 units in LR 1000 mL  250 mL Intravenous Once  . DISCONTD: oxytocin 40 units in LR 1000 mL  62.5 mL/hr Intravenous Once  . DISCONTD: pencillin G potassium IV  2.5 Million Units Intravenous Q4H  . DISCONTD: prenatal multivitamin  1 tablet Oral Daily  . DISCONTD: progesterone  200 mg Oral QHS    Results for orders placed during the hospital encounter of 02/21/12 (from the past 48 hour(s))  URINALYSIS, ROUTINE W REFLEX MICROSCOPIC     Status: Abnormal   Collection Time   02/21/12  1:15 PM      Component Value Range Comment   Color, Urine YELLOW  YELLOW    APPearance CLOUDY (*) CLEAR    Specific Gravity, Urine 1.020  1.005 - 1.030    pH 6.5  5.0 - 8.0    Glucose, UA NEGATIVE  NEGATIVE mg/dL    Hgb urine dipstick SMALL (*) NEGATIVE    Bilirubin Urine NEGATIVE  NEGATIVE    Ketones, ur NEGATIVE  NEGATIVE mg/dL    Protein, ur >161 (*) NEGATIVE mg/dL    Urobilinogen, UA 0.2  0.0 - 1.0 mg/dL    Nitrite NEGATIVE  NEGATIVE    Leukocytes, UA MODERATE (*) NEGATIVE   URINE MICROSCOPIC-ADD ON     Status: Abnormal   Collection Time   02/21/12  1:15 PM      Component Value Range Comment   Squamous Epithelial / LPF FEW (*) RARE    WBC, UA 21-50  <3 WBC/hpf    RBC / HPF 3-6  <3 RBC/hpf    Bacteria, UA MANY (*) RARE   CBC     Status: Abnormal   Collection Time   02/21/12  2:14 PM       Component Value Range Comment   WBC 12.3 (*) 4.0 - 10.5 K/uL    RBC 4.34  3.87 - 5.11 MIL/uL    Hemoglobin 12.6  12.0 - 15.0 g/dL    HCT 09.6  04.5 - 40.9 %    MCV 86.9  78.0 - 100.0 fL    MCH 29.0  26.0 - 34.0 pg    MCHC 33.4  30.0 - 36.0 g/dL    RDW 81.1 (*) 91.4 - 15.5 %    Platelets 230  150 - 400 K/uL   COMPREHENSIVE METABOLIC PANEL     Status: Abnormal   Collection Time   02/21/12  2:14 PM      Component Value Range Comment   Sodium 139  135 - 145 mEq/L    Potassium 4.9  3.5 - 5.1 mEq/L    Chloride 108  96 - 112 mEq/L    CO2 19  19 - 32 mEq/L    Glucose, Bld 85  70 - 99 mg/dL    BUN 25 (*) 6 - 23 mg/dL    Creatinine, Ser 7.82 (*) 0.50 - 1.10 mg/dL    Calcium 9.6  8.4 - 95.6 mg/dL    Total Protein 5.0 (*) 6.0 - 8.3 g/dL    Albumin 1.9 (*) 3.5 - 5.2 g/dL    AST 17  0 - 37 U/L    ALT 20  0 - 35 U/L    Alkaline Phosphatase 127 (*) 39 - 117 U/L    Total Bilirubin 0.1 (*) 0.3 - 1.2 mg/dL    GFR calc non Af Amer 33 (*) >90 mL/min    GFR calc Af Amer 38 (*) >90 mL/min   RPR     Status: Normal   Collection Time   02/21/12  2:14 PM      Component Value Range Comment   RPR NON REACTIVE  NON REACTIVE   TYPE AND SCREEN     Status: Normal   Collection Time   02/21/12  4:30 PM      Component Value Range Comment   ABO/RH(D) O POS      Antibody Screen NEG      Sample Expiration 02/24/2012     GLUCOSE, CAPILLARY     Status: Normal   Collection Time   02/21/12  7:59 PM      Component Value Range Comment   Glucose-Capillary 87  70 - 99 mg/dL   CBC     Status: Abnormal   Collection Time   02/21/12 11:13 PM      Component Value Range Comment  WBC 18.4 (*) 4.0 - 10.5 K/uL    RBC 4.33  3.87 - 5.11 MIL/uL    Hemoglobin 12.6  12.0 - 15.0 g/dL    HCT 78.2  95.6 - 21.3 %    MCV 86.4  78.0 - 100.0 fL    MCH 29.1  26.0 - 34.0 pg    MCHC 33.7  30.0 - 36.0 g/dL    RDW 08.6 (*) 57.8 - 15.5 %    Platelets 241  150 - 400 K/uL   MRSA PCR SCREENING     Status: Normal   Collection Time    02/22/12  1:00 AM      Component Value Range Comment   MRSA by PCR NEGATIVE  NEGATIVE   MAGNESIUM     Status: Abnormal   Collection Time   02/22/12  5:31 AM      Component Value Range Comment   Magnesium 4.5 (*) 1.5 - 2.5 mg/dL   CBC     Status: Abnormal   Collection Time   02/22/12  5:31 AM      Component Value Range Comment   WBC 19.7 (*) 4.0 - 10.5 K/uL    RBC 4.39  3.87 - 5.11 MIL/uL    Hemoglobin 12.7  12.0 - 15.0 g/dL    HCT 46.9  62.9 - 52.8 %    MCV 86.1  78.0 - 100.0 fL    MCH 28.9  26.0 - 34.0 pg    MCHC 33.6  30.0 - 36.0 g/dL    RDW 41.3 (*) 24.4 - 15.5 %    Platelets 227  150 - 400 K/uL   BASIC METABOLIC PANEL     Status: Abnormal   Collection Time   02/22/12  5:31 AM      Component Value Range Comment   Sodium 134 (*) 135 - 145 mEq/L    Potassium 4.6  3.5 - 5.1 mEq/L    Chloride 106  96 - 112 mEq/L    CO2 17 (*) 19 - 32 mEq/L    Glucose, Bld 76  70 - 99 mg/dL    BUN 25 (*) 6 - 23 mg/dL    Creatinine, Ser 0.10 (*) 0.50 - 1.10 mg/dL    Calcium 8.9  8.4 - 27.2 mg/dL    GFR calc non Af Amer 35 (*) >90 mL/min    GFR calc Af Amer 40 (*) >90 mL/min     No results found.  Review of Systems  Constitutional: Positive for chills and malaise/fatigue. Negative for fever, weight loss and diaphoresis.  HENT: Negative for hearing loss, ear pain, nosebleeds, congestion, sore throat, tinnitus and ear discharge.   Eyes: Negative.   Respiratory: Positive for shortness of breath. Negative for cough, hemoptysis, sputum production, wheezing and stridor.        SOB on exertion  Cardiovascular: Positive for leg swelling. Negative for chest pain, palpitations, orthopnea, claudication and PND.  Gastrointestinal: Positive for heartburn, nausea and melena. Negative for vomiting, abdominal pain, diarrhea, constipation and blood in stool.  Genitourinary: Negative.   Musculoskeletal: Negative.   Skin: Negative.   Neurological: Positive for weakness and headaches. Negative for tingling,  tremors, speech change, focal weakness, seizures and loss of consciousness.  Endo/Heme/Allergies: Negative.   Psychiatric/Behavioral: Negative for depression, suicidal ideas, hallucinations and substance abuse. The patient is nervous/anxious.   All other systems reviewed and are negative.   Blood pressure 122/68, pulse 80, temperature 97.8 F (36.6 C), temperature source Oral, resp. rate 16, height  5\' 2"  (1.575 m), weight 101.209 kg (223 lb 2 oz), SpO2 98.00%, unknown if currently breastfeeding. Physical Exam  Nursing note and vitals reviewed. Constitutional: She is oriented to person, place, and time. She appears well-developed and well-nourished. No distress.       Obese young Hispanic woman  HENT:  Head: Normocephalic and atraumatic.  Nose: Nose normal.  Mouth/Throat: Oropharynx is clear and moist. No oropharyngeal exudate.  Eyes: Conjunctivae and EOM are normal. Pupils are equal, round, and reactive to light. No scleral icterus.  Neck: Normal range of motion. Neck supple. No JVD present. No tracheal deviation present. No thyromegaly present.  Cardiovascular: Normal rate and regular rhythm.  Exam reveals no gallop and no friction rub.   No murmur heard. Respiratory: Effort normal and breath sounds normal. No stridor. No respiratory distress. She has no wheezes. She has no rales. She exhibits no tenderness.  GI: Soft. She exhibits distension. There is tenderness. There is no rebound and no guarding.       Involuting uterus palpable  Musculoskeletal: Normal range of motion. She exhibits edema. She exhibits no tenderness.       Trace-1+  Lymphadenopathy:    She has no cervical adenopathy.  Neurological: She is alert and oriented to person, place, and time. No cranial nerve deficit.  Skin: Skin is warm and dry. No rash noted. She is not diaphoretic. No erythema.  Psychiatric: She has a normal mood and affect. Her behavior is normal. Thought content normal.    Assessment/Plan: 1. Acute  renal failure and proteinuria in the peripartum period: Given her prior workup and assessment as previously undertaken from notes reviewed, it appears that she likely has a primary renal process leading to her nephrotic range proteinuria as she did not have hypertension when assessed about 3-4 weeks ago. She also did not have significant pedal edema to attribute her findings to preeclampsia. Will plan to undertake a renal biopsy after 6 weeks postpartum to allow physiology to return back to her baseline and to allow re-quantification of 24 hour protein in urine versus spot urine protein to creatinine ratio. Until then, continue with efforts at blood pressure control as is currently being done with good success and up titrate labetalol as needed to keep systolics less than 130 and diastolic less than 80 with proteinuria. I reviewed the literature regarding the safety of prescribing ACE inhibitors in the postpartum period and given the limited data in lactating women-caution is advised. 2. Hyponatremia: Likely secondary to oxytocin and hypotonic fluid administration as well as acute renal failure. Continue to cautiously monitor her sodium level and avoid excessive free water. 3. Hypertension: Fairly well controlled on labetalol 100 mg by mouth 2 times a day-room to up titrate with current heart rate. Would be cautious with magnesium use in the face of acute renal failure given propensity for bradycardia/pulmonary edema.  Amali Uhls K. 02/22/2012, 2:16 PM

## 2012-02-22 NOTE — Progress Notes (Signed)
Changed BP cuff-now using Regular size

## 2012-02-22 NOTE — Clinical Social Work Note (Signed)
PSYCHOSOCIAL ASSESSMENT ~ MATERNAL/CHILD Name:  Laura Robinson Age:  26 d Referral Date:  02/22/12 Reason/Source:  NICU Admission  I. FAMILY/HOME ENVIRONMENT A. Child's Legal Guardian  Parent(s)  Laura Robinson parent    DSS  Name:  Laura Robinson DOB:  [birth year] Age:  26 y/o Address: [address], [place], [place] [address] Name: Laura Robinson  DOB:  [birth year] Age:  26 y/o Address:  Same as above  Other Household Members/Support Persons C.   Other Support   II. PSYCHOSOCIAL DATA A. Information Source  Patient Interview  X   Family Interview           Other B. Personal assistant A Home  Medicaid     [employer]  [employer]                        Self Pay   Food Stamps      WIC  Work Scientist, physiological Housing      Section 8     Maternity Care Coordination/Child Service Coordination/Early Intervention    School  Grade      Other Cultural and Environment Information Cultural Issues Impacting Care  III. STRENGTHS  Supportive family/friends Yes  Adequate Resources   Yes Compliance with medical plan Yes Home prepared for Child (including basic supplies) Yes - Couple stated they had two baby showers. Understanding of illness         Yes Other  IV. RISK FACTORS AND CURRENT PROBLEMS V. No Problems Noted VI. Substance Abuse                                           Pt Family             Mental Illness     Pt Family               Family/Relationship Issues   Pt Family      Abuse/Neglect/Domestic Violence   Pt Family   Financial Resources     Pt Family  Transportation     Pt Family  DSS Involvement    Pt Family  Adjustment to Illness    Pt Family   Knowledge/Cognitive Deficit   Pt Family   Compliance with Treatment   Pt Family   Basic Needs (food, housing, etc)  Pt Family  Housing Concerns    Pt Family  Other             VII. SOCIAL WORK ASSESSMENT SW received referral due to NICU admission.  Baby was preterm.  This  is the couple's first child.  FOB was also present during the visit.  He appeared very attentive to pt.  They stated they felt they were receiving very good information from the NICU staff regarding the baby's condition.  They had no questions at this time.  SW validated their feelings and informed them of SW role in the hospital.  They stated they understood.  Pt displayed a bright affect when discussing the baby.  SW discussed with unit RN, Idalia Needle, who stated MD and other staff had observed a flatter affect previously.    Pt works and has Media planner.  The couple stated they have ample supplies for  the baby.  They also have both sets of parents in [place] and extended family in the area.  They also stated they has good support through friends.  Ongoing support and education will be needed.   VIII. SOCIAL WORK PLAN (In [place]) No Further Intervention Required/No Barriers to Discharge Psychosocial Support and Ongoing Assessment of Needs Patient/Family  Education Child Protective Services Report  [place]        Date Information/Referral to [facility]   Other

## 2012-02-22 NOTE — Anesthesia Postprocedure Evaluation (Signed)
Anesthesia Post Note  Patient: Laura Robinson  Procedure(s) Performed: * No procedures listed *  Anesthesia type: Epidural  Patient location: Mother/Baby  Post pain: Pain level controlled  Post assessment: Post-op Vital signs reviewed  Last Vitals:  Filed Vitals:   02/22/12 0600  BP: 134/73  Pulse: 77  Temp:   Resp: 16    Post vital signs: Reviewed  Level of consciousness: awake  Complications: No apparent anesthesia complications

## 2012-02-22 NOTE — Progress Notes (Signed)
02/22/12 1000  Vitals  Pulse Rate 86   BP ! 151/104 mmHg  MAP (mmHg) 116   Oxygen Therapy  SpO2 100 %  O2 Device None (Room air)   Dr. Vincente Poli made aware of BP's-see new orders

## 2012-02-23 LAB — CBC
Hemoglobin: 10.8 g/dL — ABNORMAL LOW (ref 12.0–15.0)
MCH: 28.8 pg (ref 26.0–34.0)
Platelets: 196 10*3/uL (ref 150–400)
RBC: 3.75 MIL/uL — ABNORMAL LOW (ref 3.87–5.11)
WBC: 10.8 10*3/uL — ABNORMAL HIGH (ref 4.0–10.5)

## 2012-02-23 LAB — COMPREHENSIVE METABOLIC PANEL
ALT: 15 U/L (ref 0–35)
AST: 12 U/L (ref 0–37)
Alkaline Phosphatase: 112 U/L (ref 39–117)
CO2: 20 mEq/L (ref 19–32)
Chloride: 105 mEq/L (ref 96–112)
GFR calc Af Amer: 39 mL/min — ABNORMAL LOW (ref >90)
GFR calc non Af Amer: 34 mL/min — ABNORMAL LOW (ref >90)
Glucose, Bld: 93 mg/dL (ref 70–99)
Potassium: 4.1 mEq/L (ref 3.5–5.1)
Sodium: 135 mEq/L (ref 135–145)

## 2012-02-23 MED ORDER — LANOLIN HYDROUS EX OINT: TOPICAL_OINTMENT | CUTANEOUS | Status: DC | PRN

## 2012-02-23 MED ORDER — ONDANSETRON HCL 4 MG PO TABS: 4.0000 mg | ORAL_TABLET | ORAL | Status: DC | PRN

## 2012-02-23 MED ORDER — WITCH HAZEL-GLYCERIN EX PADS: 1.0000 "application " | MEDICATED_PAD | CUTANEOUS | Status: DC | PRN

## 2012-02-23 MED ORDER — PRENATAL MULTIVITAMIN CH
1.0000 | ORAL_TABLET | Freq: Every day | ORAL | Status: DC
  Administered 2012-02-23 – 2012-02-24 (×2): 1 via ORAL
  Filled 2012-02-23 (×2): qty 1

## 2012-02-23 MED ORDER — TETANUS-DIPHTH-ACELL PERTUSSIS 5-2.5-18.5 LF-MCG/0.5 IM SUSP
0.5000 mL | Freq: Once | INTRAMUSCULAR | Status: AC
  Administered 2012-02-24: 0.5 mL via INTRAMUSCULAR
  Filled 2012-02-23: qty 0.5

## 2012-02-23 MED ORDER — FLEET ENEMA 7-19 GM/118ML RE ENEM: 1.0000 | ENEMA | Freq: Every day | RECTAL | Status: DC | PRN

## 2012-02-23 MED ORDER — OXYCODONE-ACETAMINOPHEN 5-325 MG PO TABS
1.0000 | ORAL_TABLET | Freq: Four times a day (QID) | ORAL | Status: DC | PRN
  Administered 2012-02-23: 1 via ORAL
  Administered 2012-02-23: 2 via ORAL
  Filled 2012-02-23 (×3): qty 1

## 2012-02-23 MED ORDER — MEASLES, MUMPS & RUBELLA VAC ~~LOC~~ INJ
0.5000 mL | INJECTION | Freq: Once | SUBCUTANEOUS | Status: DC
  Filled 2012-02-23: qty 0.5

## 2012-02-23 MED ORDER — ZOLPIDEM TARTRATE 5 MG PO TABS: 5.0000 mg | ORAL_TABLET | Freq: Every evening | ORAL | Status: DC | PRN

## 2012-02-23 MED ORDER — SODIUM CHLORIDE 0.9 % IJ SOLN
3.0000 mL | Freq: Two times a day (BID) | INTRAMUSCULAR | Status: DC
  Administered 2012-02-23 (×2): 3 mL via INTRAVENOUS

## 2012-02-23 MED ORDER — ONDANSETRON HCL 4 MG/2ML IJ SOLN: 4.0000 mg | INTRAMUSCULAR | Status: DC | PRN

## 2012-02-23 MED ORDER — BISACODYL 10 MG RE SUPP: 10.0000 mg | Freq: Every day | RECTAL | Status: DC | PRN

## 2012-02-23 MED ORDER — DIBUCAINE 1 % RE OINT: 1.0000 "application " | TOPICAL_OINTMENT | RECTAL | Status: DC | PRN

## 2012-02-23 MED ORDER — SIMETHICONE 80 MG PO CHEW: 80.0000 mg | CHEWABLE_TABLET | ORAL | Status: DC | PRN

## 2012-02-23 MED ORDER — BENZOCAINE-MENTHOL 20-0.5 % EX AERO: 1.0000 "application " | INHALATION_SPRAY | CUTANEOUS | Status: DC | PRN

## 2012-02-23 MED ORDER — DIPHENHYDRAMINE HCL 25 MG PO CAPS: 25.0000 mg | ORAL_CAPSULE | Freq: Four times a day (QID) | ORAL | Status: DC | PRN

## 2012-02-23 MED ORDER — SENNOSIDES-DOCUSATE SODIUM 8.6-50 MG PO TABS
2.0000 | ORAL_TABLET | Freq: Every day | ORAL | Status: DC
  Administered 2012-02-23: 2 via ORAL

## 2012-02-23 MED ORDER — SODIUM CHLORIDE 0.9 % IJ SOLN: 3.0000 mL | INTRAMUSCULAR | Status: DC | PRN

## 2012-02-23 MED ORDER — IBUPROFEN 800 MG PO TABS: 800.0000 mg | ORAL_TABLET | Freq: Three times a day (TID) | ORAL | Status: DC | PRN

## 2012-02-23 NOTE — Progress Notes (Signed)
Pt transferred ambulatory to Rm #[id number], Thayer Ohm updated.

## 2012-02-23 NOTE — Progress Notes (Signed)
Post Partum Day 2 Subjective: no complaints  Objective: Blood pressure 120/66, pulse 82, temperature 98 F (36.7 C), temperature source Oral, resp. rate 16, height 5\' 2"  (1.575 m), weight 101.696 kg (224 lb 3.2 oz), SpO2 99.00%, unknown if currently breastfeeding.  Physical Exam:  General: alert Lochia: appropriate Uterine Fundus: firm Incision: NA DVT Evaluation: No evidence of DVT seen on physical exam.   Basename 02/23/12 0520 02/22/12 0531  HGB 10.8* 12.7  HCT 32.9* 37.8    Assessment/Plan: PP #2, BP is stable, will D/C MgSo4 and send to floor, recheck labs AM   LOS: 2 days   Laura Robinson M 02/23/2012, 8:21 AM

## 2012-02-23 NOTE — Progress Notes (Signed)
UR chart review completed.  

## 2012-02-23 NOTE — Progress Notes (Signed)
Patient ID: Laura Robinson, female   DOB: 03/04/1986, 26 y.o.   MRN: [id number] S:feels good O:BP 120/70  Pulse 80  Temp 98.2 F (36.8 C) (Oral)  Resp 16  Ht 5\' 2"  (1.575 m)  Wt 101.696 kg (224 lb 3.2 oz)  BMI 41.01 kg/m2  SpO2 100%  Breastfeeding? Unknown  Intake/Output Summary (Last 24 hours) at 02/23/12 1313 Last data filed at 02/23/12 1220  Gross per 24 hour  Intake 3289.16 ml  Output   3700 ml  Net -410.84 ml   Weight change: -1.089 kg (-2 lb 6.4 oz) Gen:WD obese hispanic female in NAD CVS:RRR no rub Resp:CTA ION:[id number], mildly tender Ext:no edema   Lab 02/23/12 0520 02/22/12 0531 02/21/12 1414  NA 135 134* 139  K 4.1 4.6 4.9  CL 105 106 108  CO2 20 17* 19  GLUCOSE 93 76 85  BUN 24* 25* 25*  CREATININE 1.99* 1.96* 2.05*  ALBUMIN 1.6* -- 1.9*  CALCIUM 7.7* 8.9 9.6  PHOS -- -- --  AST 12 -- 17  ALT 15 -- 20   Liver Function Tests:  Lab 02/23/12 0520 02/21/12 1414  AST 12 17  ALT 15 20  ALKPHOS 112 127*  BILITOT 0.1* 0.1*  PROT 4.4* 5.0*  ALBUMIN 1.6* 1.9*   No results found for this basename: LIPASE:3,AMYLASE:3 in the last 168 hours No results found for this basename: AMMONIA:3 in the last 168 hours CBC:  Lab 02/23/12 0520 02/22/12 0531 02/21/12 2313 02/21/12 1414  WBC 10.8* 19.7* 18.4* --  NEUTROABS -- -- -- --  HGB 10.8* 12.7 12.6 --  HCT 32.9* 37.8 37.4 --  MCV 87.7 86.1 86.4 86.9  PLT 196 227 241 --   Cardiac Enzymes: No results found for this basename: CKTOTAL:5,CKMB:5,CKMBINDEX:5,TROPONINI:5 in the last 168 hours CBG:  Lab 02/21/12 1959  GLUCAP 87    Iron Studies: No results found for this basename: IRON,TIBC,TRANSFERRIN,FERRITIN in the last 72 hours Studies/Results: No results found.    Marland Kitchen labetalol  100 mg Oral BID  . measles, mumps and rubella vaccine  0.5 mL Subcutaneous Once  . prenatal multivitamin  1 tablet Oral Daily  . senna-docusate  2 tablet Oral QHS  . sodium chloride  3 mL Intravenous Q12H  . TDaP  0.5 mL  Intramuscular Once  . DISCONTD: prenatal multivitamin  1 tablet Oral Daily  . DISCONTD: senna-docusate  2 tablet Oral QHS  . DISCONTD: TDaP  0.5 mL Intramuscular Once    BMET    Component Value Date/Time   NA 135 02/23/2012 0520   K 4.1 02/23/2012 0520   CL 105 02/23/2012 0520   CO2 20 02/23/2012 0520   GLUCOSE 93 02/23/2012 0520   BUN 24* 02/23/2012 0520   CREATININE 1.99* 02/23/2012 0520   CREATININE 1.45* 01/28/2012 1730   CALCIUM 7.7* 02/23/2012 0520   GFRNONAA 34* 02/23/2012 0520   GFRAA 39* 02/23/2012 0520   CBC    Component Value Date/Time   WBC 10.8* 02/23/2012 0520   RBC 3.75* 02/23/2012 0520   HGB 10.8* 02/23/2012 0520   HCT 32.9* 02/23/2012 0520   PLT 196 02/23/2012 0520   MCV 87.7 02/23/2012 0520   MCH 28.8 02/23/2012 0520   MCHC 32.8 02/23/2012 0520   RDW 16.4* 02/23/2012 0520   LYMPHSABS 2.4 01/11/2012 1645   MONOABS 0.7 01/11/2012 1645   EOSABS 0.3 01/11/2012 1645   BASOSABS 0.0 01/11/2012 1645     Assessment/Plan:  1. AKI with nephrotic range proteinuria- good uop.  Agree with re-evaluation of proteinuria in 6 weeks to decide if renal biopsy is warranted.  Stop all NSAID's/Cox-II inhibitors and cont to follow renal fnx and proteinuria 2. Hyponatremia- resolving 3. HTN/pre-eclampsia- well controlled.  Cont to follow with labetolol 4. Disposition- hopeful d/c in am and f/u with Dr. Allena Katz in 4-6 weeks but will need to monitor her BP, renal function and proteinuria weekly.  Nikai Quest A

## 2012-02-24 ENCOUNTER — Encounter (HOSPITAL_COMMUNITY)
Admit: 2012-02-24 | Discharge: 2012-02-24 | Disposition: A | Discharge status: Home / Self Care | Payer: PRIVATE HEALTH INSURANCE | Attending: Obstetrics and Gynecology | Admitting: Obstetrics and Gynecology

## 2012-02-24 ENCOUNTER — Encounter (HOSPITAL_COMMUNITY): Payer: PRIVATE HEALTH INSURANCE

## 2012-02-24 LAB — CBC
MCV: 89.8 fL (ref 78.0–100.0)
Platelets: 213 10*3/uL (ref 150–400)
RDW: 16.9 % — ABNORMAL HIGH (ref 11.5–15.5)
WBC: 10.8 10*3/uL — ABNORMAL HIGH (ref 4.0–10.5)

## 2012-02-24 LAB — PROTEIN / CREATININE RATIO, URINE
Protein Creatinine Ratio: 5.76 — ABNORMAL HIGH (ref 0.00–0.15)
Total Protein, Urine: 287.8 mg/dL

## 2012-02-24 LAB — COMPREHENSIVE METABOLIC PANEL
ALT: 14 U/L (ref 0–35)
AST: 13 U/L (ref 0–37)
Albumin: 1.9 g/dL — ABNORMAL LOW (ref 3.5–5.2)
Calcium: 8.6 mg/dL (ref 8.4–10.5)
Chloride: 108 mEq/L (ref 96–112)
Creatinine, Ser: 1.87 mg/dL — ABNORMAL HIGH (ref 0.50–1.10)
Sodium: 140 mEq/L (ref 135–145)
Total Bilirubin: 0.1 mg/dL — ABNORMAL LOW (ref 0.3–1.2)

## 2012-02-24 MED ORDER — LABETALOL HCL 100 MG PO TABS: 100.0000 mg | ORAL_TABLET | Freq: Two times a day (BID) | ORAL | Status: DC

## 2012-02-24 NOTE — Progress Notes (Signed)
Patient ID: Laura Robinson, female   DOB: 11-Jan-1986, 26 y.o.   MRN: [id number] S:feels good, no complaints O:BP 128/83  Pulse 72  Temp 98 F (36.7 C) (Oral)  Resp 16  Ht 5\' 2"  (1.575 m)  Wt 99.791 kg (220 lb)  BMI 40.24 kg/m2  SpO2 97%  Breastfeeding? Unknown  Intake/Output Summary (Last 24 hours) at 02/24/12 0927 Last data filed at 02/24/12 4098  Gross per 24 hour  Intake   1420 ml  Output   4200 ml  Net  -2780 ml   Weight change: -1.905 kg (-4 lb 3.2 oz) Gen:WD WN HF in NAD CVS:RRR no rub Resp:CTA JXB:JYNWGN Ext:non edema   Lab 02/24/12 0520 02/23/12 0520 02/22/12 0531 02/21/12 1414  NA 140 135 134* 139  K 4.1 4.1 4.6 4.9  CL 108 105 106 108  CO2 22 20 17* 19  GLUCOSE 90 93 76 85  BUN 23 24* 25* 25*  CREATININE 1.87* 1.99* 1.96* 2.05*  ALBUMIN 1.9* 1.6* -- 1.9*  CALCIUM 8.6 7.7* 8.9 9.6  PHOS -- -- -- --  AST 13 12 -- 17  ALT 14 15 -- 20   Liver Function Tests:  Lab 02/24/12 0520 02/23/12 0520 02/21/12 1414  AST 13 12 17   ALT 14 15 20   ALKPHOS 108 112 127*  BILITOT 0.1* 0.1* 0.1*  PROT 5.4* 4.4* 5.0*  ALBUMIN 1.9* 1.6* 1.9*   No results found for this basename: LIPASE:3,AMYLASE:3 in the last 168 hours No results found for this basename: AMMONIA:3 in the last 168 hours CBC:  Lab 02/24/12 0520 02/23/12 0520 02/22/12 0531 02/21/12 2313 02/21/12 1414  WBC 10.8* 10.8* 19.7* -- --  NEUTROABS -- -- -- -- --  HGB 11.6* 10.8* 12.7 -- --  HCT 36.1 32.9* 37.8 -- --  MCV 89.8 87.7 86.1 86.4 86.9  PLT 213 196 227 -- --   Cardiac Enzymes: No results found for this basename: CKTOTAL:5,CKMB:5,CKMBINDEX:5,TROPONINI:5 in the last 168 hours CBG:  Lab 02/21/12 1959  GLUCAP 87    Iron Studies: No results found for this basename: IRON,TIBC,TRANSFERRIN,FERRITIN in the last 72 hours Studies/Results: No results found.    Marland Kitchen labetalol  100 mg Oral BID  . measles, mumps and rubella vaccine  0.5 mL Subcutaneous Once  . prenatal multivitamin  1 tablet Oral Daily  .  senna-docusate  2 tablet Oral QHS  . sodium chloride  3 mL Intravenous Q12H  . TDaP  0.5 mL Intramuscular Once    BMET    Component Value Date/Time   NA 140 02/24/2012 0520   K 4.1 02/24/2012 0520   CL 108 02/24/2012 0520   CO2 22 02/24/2012 0520   GLUCOSE 90 02/24/2012 0520   BUN 23 02/24/2012 0520   CREATININE 1.87* 02/24/2012 0520   CREATININE 1.45* 01/28/2012 1730   CALCIUM 8.6 02/24/2012 0520   GFRNONAA 37* 02/24/2012 0520   GFRAA 43* 02/24/2012 0520   CBC    Component Value Date/Time   WBC 10.8* 02/24/2012 0520   RBC 4.02 02/24/2012 0520   HGB 11.6* 02/24/2012 0520   HCT 36.1 02/24/2012 0520   PLT 213 02/24/2012 0520   MCV 89.8 02/24/2012 0520   MCH 28.9 02/24/2012 0520   MCHC 32.1 02/24/2012 0520   RDW 16.9* 02/24/2012 0520   LYMPHSABS 2.4 01/11/2012 1645   MONOABS 0.7 01/11/2012 1645   EOSABS 0.3 01/11/2012 1645   BASOSABS 0.0 01/11/2012 1645     Assessment/Plan: 1. AKI with nephrotic range proteinuria- good uop.  Agree with re-evaluation of proteinuria in 6 weeks to decide if renal biopsy is warranted. Stop all NSAID's/Cox-II inhibitors and cont to follow renal fnx and proteinuria 2. Hyponatremia- resolved 3. HTN/pre-eclampsia- well controlled. Cont to follow with labetolol and recommended digital BP monitor to her readings while on labetolol. 4. Obesity- recommend diet/weight loss 5. ABLA- resolved 6. Disposition- hopeful d/c in am and f/u with Dr. Allena Katz on April 01, 2012 at 11am. She will need to monitor her BP, renal function and proteinuria weekly with Dr. Vincente Poli and have those readings sent to our office at [facility], [address] [place], [place] [address]. 7. Will sign off for now, please call with questions or concerns.  Ahlayah Tarkowski A

## 2012-02-24 NOTE — Progress Notes (Signed)
Post Partum Day 3 Subjective: no complaints, up ad lib, voiding and tolerating PO  Objective: Blood pressure 128/83, pulse 72, temperature 98 F (36.7 C), temperature source Oral, resp. rate 16, height 5\' 2"  (1.575 m), weight 99.791 kg (220 lb), SpO2 97.00%, unknown if currently breastfeeding.  Physical Exam:  General: alert, cooperative and no distress Lochia: appropriate Uterine Fundus: firm Incision:  DVT Evaluation: No evidence of DVT seen on physical exam.   Basename 02/24/12 0520 02/23/12 0520  HGB 11.6* 10.8*  HCT 36.1 32.9*    Assessment/Plan: Discharge home Cont Labetalol 100mg  BID    LOS: 3 days   Caralina Nop C 02/24/2012, 9:43 AM

## 2012-02-24 NOTE — Progress Notes (Signed)
Pt ambulated out  Teaching complete 

## 2012-02-27 NOTE — Discharge Summary (Signed)
Obstetric Discharge Summary Reason for Admission: observation/evaluation and preeclampsia and shortened cervix Prenatal Procedures: ultrasound Intrapartum Procedures: spontaneous vaginal delivery Postpartum Procedures: none Complications-Operative and Postpartum: 1 degree perineal laceration Hemoglobin  Date Value Range Status  02/24/2012 11.6* 12.0 - 15.0 g/dL Final     HCT  Date Value Range Status  02/24/2012 36.1  36.0 - 46.0 % Final    Physical Exam:  General: alert and cooperative Lochia: appropriate Uterine Fundus: firm Incision: perineum intact DVT Evaluation: No evidence of DVT seen on physical exam.  Discharge Diagnoses: s/p vag delivery at 27 3/7 weeks with 1 degree laceration   Discharge Information: Date: 02/27/2012 Activity: pelvic rest Diet: routine Medications: PNV and labetalol Condition: stable Instructions: refer to practice specific booklet Discharge to: home   Newborn Data: Live born female  Birth Weight: 3 lb 11.6 oz (1690 g) APGAR: 9, 9  Home with nicu.  Benzion Mesta G 02/27/2012, 8:31 AM

## 2012-03-26 ENCOUNTER — Encounter (HOSPITAL_COMMUNITY)
Admission: RE | Admit: 2012-03-26 | Discharge: 2012-03-26 | Disposition: A | Discharge status: Home / Self Care | Payer: PRIVATE HEALTH INSURANCE | Source: Ambulatory Visit | Attending: Obstetrics and Gynecology | Admitting: Obstetrics and Gynecology

## 2012-03-26 DIAGNOSIS — O923 Agalactia: Secondary | ICD-10-CM | POA: Insufficient documentation

## 2012-04-07 ENCOUNTER — Other Ambulatory Visit (HOSPITAL_COMMUNITY): Payer: Self-pay | Admitting: Nephrology

## 2012-04-07 DIAGNOSIS — R809 Proteinuria, unspecified: Secondary | ICD-10-CM

## 2012-04-14 ENCOUNTER — Other Ambulatory Visit: Payer: Self-pay | Admitting: Radiology

## 2012-04-20 ENCOUNTER — Other Ambulatory Visit: Payer: Self-pay | Admitting: Radiology

## 2012-04-22 ENCOUNTER — Encounter (HOSPITAL_COMMUNITY): Payer: Self-pay

## 2012-04-22 ENCOUNTER — Ambulatory Visit (HOSPITAL_COMMUNITY)
Admission: RE | Admit: 2012-04-22 | Discharge: 2012-04-22 | Disposition: A | Discharge status: Home / Self Care | Payer: BC Managed Care – PPO | Source: Ambulatory Visit | Attending: Nephrology | Admitting: Nephrology

## 2012-04-22 DIAGNOSIS — N032 Chronic nephritic syndrome with diffuse membranous glomerulonephritis: Secondary | ICD-10-CM | POA: Insufficient documentation

## 2012-04-22 DIAGNOSIS — E669 Obesity, unspecified: Secondary | ICD-10-CM | POA: Insufficient documentation

## 2012-04-22 DIAGNOSIS — R809 Proteinuria, unspecified: Secondary | ICD-10-CM | POA: Insufficient documentation

## 2012-04-22 LAB — APTT: aPTT: 32 seconds (ref 24–37)

## 2012-04-22 LAB — CBC
HCT: 39.4 % (ref 36.0–46.0)
Hemoglobin: 13.3 g/dL (ref 12.0–15.0)
RBC: 4.7 MIL/uL (ref 3.87–5.11)
WBC: 6.5 10*3/uL (ref 4.0–10.5)

## 2012-04-22 LAB — PROTIME-INR
INR: 0.98 (ref 0.00–1.49)
Prothrombin Time: 13.2 seconds (ref 11.6–15.2)

## 2012-04-22 MED ORDER — SODIUM CHLORIDE 0.9 % IV SOLN: Freq: Once | INTRAVENOUS | Status: DC

## 2012-04-22 MED ORDER — FENTANYL CITRATE 0.05 MG/ML IJ SOLN
INTRAMUSCULAR | Status: AC
  Filled 2012-04-22: qty 4

## 2012-04-22 MED ORDER — MIDAZOLAM HCL 2 MG/2ML IJ SOLN
INTRAMUSCULAR | Status: AC
  Filled 2012-04-22: qty 4

## 2012-04-22 MED ORDER — SODIUM CHLORIDE 0.9 % IV SOLN
INTRAVENOUS | Status: AC | PRN
  Administered 2012-04-22: 20 mL/h via INTRAVENOUS

## 2012-04-22 MED ORDER — MIDAZOLAM HCL 5 MG/5ML IJ SOLN
INTRAMUSCULAR | Status: AC | PRN
  Administered 2012-04-22: 1 mg via INTRAVENOUS
  Administered 2012-04-22: 0.5 mg via INTRAVENOUS

## 2012-04-22 MED ORDER — FENTANYL CITRATE 0.05 MG/ML IJ SOLN
INTRAMUSCULAR | Status: AC | PRN
  Administered 2012-04-22: 25 ug via INTRAVENOUS
  Administered 2012-04-22: 50 ug via INTRAVENOUS

## 2012-04-22 NOTE — ED Notes (Signed)
In nurses station awaiting Short stay bed. Remains prone. Husband present.

## 2012-04-22 NOTE — H&P (Signed)
Laura Robinson is an 26 y.o. female.   Chief Complaint: proteinuria detected during pregnancy 5/13 Has continued and worsening even after delivery(6/13) Scheduled now for renal biopsy HPI: HTN; proteinuria  Past Medical History  Diagnosis Date  . Obesity   . Pregnancy induced hypertension   . Preterm labor   . Gestational diabetes mellitus in childbirth, diet controlled     Past Surgical History  Procedure Date  . No past surgeries     Family History  Problem Relation Age of Onset  . Asthma Brother   . Hypertension Brother   . Kidney disease Brother   . Cancer Maternal Grandmother   . Hypertension Maternal Grandmother   . Anesthesia problems Neg Hx    Social History:  reports that she quit smoking about 9 months ago. Her smoking use included Cigarettes. She has a 1 pack-year smoking history. She has never used smokeless tobacco. She reports that she does not drink alcohol or use illicit drugs.  Allergies: No Known Allergies   (Not in a hospital admission)  No results found for this or any previous visit (from the past 48 hour(s)). No results found.  Review of Systems  Constitutional: Negative for fever.  Respiratory: Negative for shortness of breath.   Gastrointestinal: Positive for abdominal pain. Negative for nausea and vomiting.  Neurological: Negative for headaches.  Psychiatric/Behavioral: The patient is nervous/anxious.     unknown if currently breastfeeding. Physical Exam  Constitutional: She is oriented to person, place, and time. She appears well-developed and well-nourished.  Cardiovascular: Normal rate, regular rhythm and normal heart sounds.   No murmur heard. Respiratory: Effort normal and breath sounds normal. She has no wheezes.  GI: Soft. Bowel sounds are normal. There is no tenderness.       obese  Musculoskeletal: Normal range of motion.  Neurological: She is alert and oriented to person, place, and time.  Skin: Skin is warm and dry.    Psychiatric: She has a normal mood and affect. Her behavior is normal. Judgment and thought content normal.     Assessment/Plan Worsening proteinuria Scheduled for renal biopsy Pt and husband aware of procedure benefits and risks and agreeable to proceed Consent signed  Laura Robinson A 04/22/2012, 9:16 AM

## 2012-04-22 NOTE — ED Notes (Signed)
Tolerated fluids well.

## 2012-04-22 NOTE — Procedures (Signed)
Successful LT renal cortex core bx(16g x 2) No comp Stable Path sent to [facility]

## 2012-04-26 ENCOUNTER — Encounter (HOSPITAL_COMMUNITY)
Admission: RE | Admit: 2012-04-26 | Discharge: 2012-04-26 | Disposition: A | Discharge status: Home / Self Care | Payer: PRIVATE HEALTH INSURANCE | Source: Ambulatory Visit | Attending: Obstetrics and Gynecology | Admitting: Obstetrics and Gynecology

## 2012-04-26 DIAGNOSIS — O923 Agalactia: Secondary | ICD-10-CM | POA: Insufficient documentation

## 2012-04-28 ENCOUNTER — Ambulatory Visit
Admission: RE | Admit: 2012-04-28 | Discharge: 2012-04-28 | Disposition: A | Discharge status: Home / Self Care | Payer: PRIVATE HEALTH INSURANCE | Source: Ambulatory Visit | Attending: Nephrology | Admitting: Nephrology

## 2012-04-28 ENCOUNTER — Other Ambulatory Visit: Payer: Self-pay | Admitting: Nephrology

## 2012-04-28 DIAGNOSIS — R52 Pain, unspecified: Secondary | ICD-10-CM

## 2013-02-07 ENCOUNTER — Encounter: Payer: BC Managed Care – PPO | Attending: Nephrology | Admitting: *Deleted

## 2013-02-07 ENCOUNTER — Encounter: Payer: Self-pay | Admitting: *Deleted

## 2013-02-07 DIAGNOSIS — E669 Obesity, unspecified: Secondary | ICD-10-CM | POA: Insufficient documentation

## 2013-02-07 DIAGNOSIS — Z713 Dietary counseling and surveillance: Secondary | ICD-10-CM | POA: Insufficient documentation

## 2013-02-07 NOTE — Progress Notes (Signed)
Medical Nutrition Therapy:  Appt start time: 1030 end time:  1130.  Assessment:  Primary concern today: Obesity. Patient reports that she desires weight loss after pregnancy (11 months ago). She gained about 50 pounds during her pregnancy partially due to complications/bed rest, but has been unable to lose the weight. Additionally, she was diagnosed with a kidney condition requiring her to lose weight and restrict potassium and sodium in the diet. She was eating healthy and exercising regularly (gym-Zumba, weight lifting classes, yoga, swimming) prior to pregnancy, but has not gotten back into the routine. She has difficulty knowing what to prepare for meals due to potassium restriction. She eats out at [employer] regularly. Finally, she reports frustration at not being in the shape she was in prior to pregnancy.   MEDICATIONS: Prenatal vitamin   DIETARY INTAKE:   Usual eating pattern includes 3 meals and 2 snacks per day.  24-hr recall:  B ( AM): English muffin, peanut butter, peach, coffee (sugar, cream/almond milk)  Snk ( AM): None  L ( PM): Leftovers OR sandwich (ham, mayo, mustard, pickles, lettuce, tomato), chips, sweet tea Snk ( PM): Protein bar, chips, fruit, chocolate, peanuts D ( PM): Vegetable, meat, rice/potato/corn/pasta, sweet tea/water Snk ( PM): None Beverages: Water, coffee, sweet tea  Usual physical activity: None  Estimated energy needs: 1500 calories 188 g carbohydrates 94 g protein 42 g fat  Progress Towards Goal(s):  In progress.   Nutritional Diagnosis:  [place]-3.3 Overweight/obesity As related to excessive weight gain during pregnancy.  As evidenced by BMI 40.3.    Intervention:  Nutrition counseling. We discussed strategies for weight loss, including balancing nutrients (carbs, protein, fat), portion control, healthy snacks, and exercise.   Goals:  1. 1 pound weight loss per week.  2. Monitor portion size at meals (2 starches, 3 oz meat, 1-2 portions  non-starchy vegetables).  3. Plan a healthy snack in the afternoon (fruit, protein bar, string cheese, peanuts - 150 kcal) 4. Walk at least 30 minutes 3 days weekly. Work up to 5 days weekly and/or 60 minutes at a time.   Handouts given during visit include:  Weight loss tips  Yellow meal plan card  Monitoring/Evaluation:  Dietary intake, exercise, and body weight in 1 month(s).

## 2013-03-10 ENCOUNTER — Ambulatory Visit: Payer: BC Managed Care – PPO | Admitting: *Deleted

## 2013-03-11 ENCOUNTER — Ambulatory Visit: Payer: BC Managed Care – PPO | Admitting: *Deleted

## 2013-07-06 ENCOUNTER — Other Ambulatory Visit (HOSPITAL_COMMUNITY): Payer: Self-pay | Admitting: Obstetrics and Gynecology

## 2013-07-06 DIAGNOSIS — O3680X0 Pregnancy with inconclusive fetal viability, not applicable or unspecified: Secondary | ICD-10-CM

## 2013-07-07 ENCOUNTER — Ambulatory Visit (HOSPITAL_COMMUNITY)
Admission: RE | Admit: 2013-07-07 | Discharge: 2013-07-07 | Disposition: A | Discharge status: Home / Self Care | Payer: PRIVATE HEALTH INSURANCE | Source: Ambulatory Visit | Attending: Obstetrics and Gynecology | Admitting: Obstetrics and Gynecology

## 2013-07-11 ENCOUNTER — Ambulatory Visit (HOSPITAL_COMMUNITY): Payer: BC Managed Care – PPO

## 2013-07-12 ENCOUNTER — Other Ambulatory Visit (HOSPITAL_COMMUNITY): Payer: Self-pay | Admitting: Obstetrics and Gynecology

## 2013-07-12 DIAGNOSIS — Z3689 Encounter for other specified antenatal screening: Secondary | ICD-10-CM

## 2013-07-13 ENCOUNTER — Other Ambulatory Visit (HOSPITAL_COMMUNITY): Payer: Self-pay | Admitting: Obstetrics and Gynecology

## 2013-07-13 ENCOUNTER — Encounter (HOSPITAL_COMMUNITY): Payer: Self-pay

## 2013-07-13 ENCOUNTER — Ambulatory Visit (HOSPITAL_COMMUNITY)
Admission: RE | Admit: 2013-07-13 | Discharge: 2013-07-13 | Disposition: A | Discharge status: Home / Self Care | Payer: PRIVATE HEALTH INSURANCE | Source: Ambulatory Visit | Attending: Obstetrics and Gynecology | Admitting: Obstetrics and Gynecology

## 2013-07-13 DIAGNOSIS — O9989 Other specified diseases and conditions complicating pregnancy, childbirth and the puerperium: Secondary | ICD-10-CM | POA: Insufficient documentation

## 2013-07-13 DIAGNOSIS — N289 Disorder of kidney and ureter, unspecified: Secondary | ICD-10-CM

## 2013-07-13 DIAGNOSIS — O10019 Pre-existing essential hypertension complicating pregnancy, unspecified trimester: Secondary | ICD-10-CM | POA: Insufficient documentation

## 2013-07-13 DIAGNOSIS — E669 Obesity, unspecified: Secondary | ICD-10-CM | POA: Insufficient documentation

## 2013-07-13 DIAGNOSIS — Z3689 Encounter for other specified antenatal screening: Secondary | ICD-10-CM

## 2013-07-13 DIAGNOSIS — Z8751 Personal history of pre-term labor: Secondary | ICD-10-CM | POA: Insufficient documentation

## 2013-08-19 IMAGING — US US RENAL
1 series · 14 of 25 positions shown · non-contrast
Comparison: None.

CLINICAL DATA: Decreased renal function in 28-week pregnant
patient, hypertension

RENAL/URINARY TRACT ULTRASOUND COMPLETE

[Series 1: us renal · 14 of 27 slices shown]
[im 1/27]
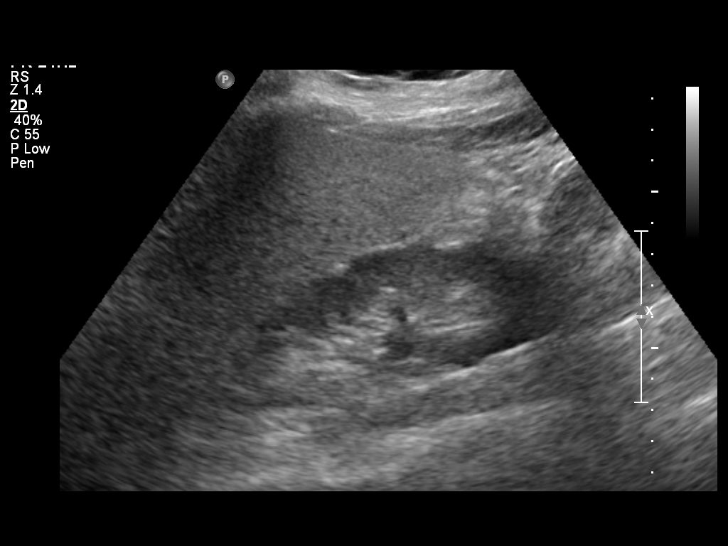
[im 3/27]
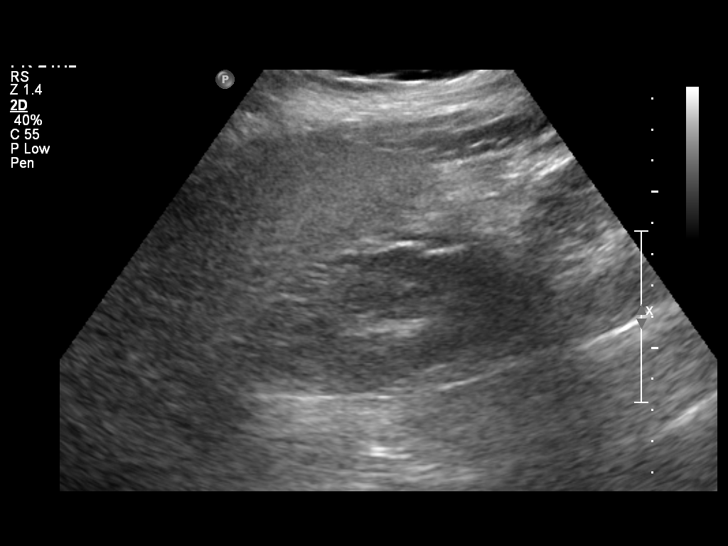
[im 5/27]
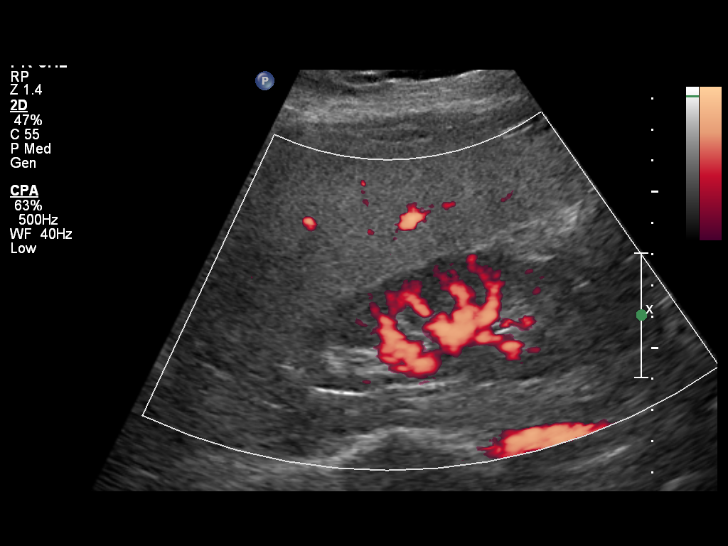
[im 7/27]
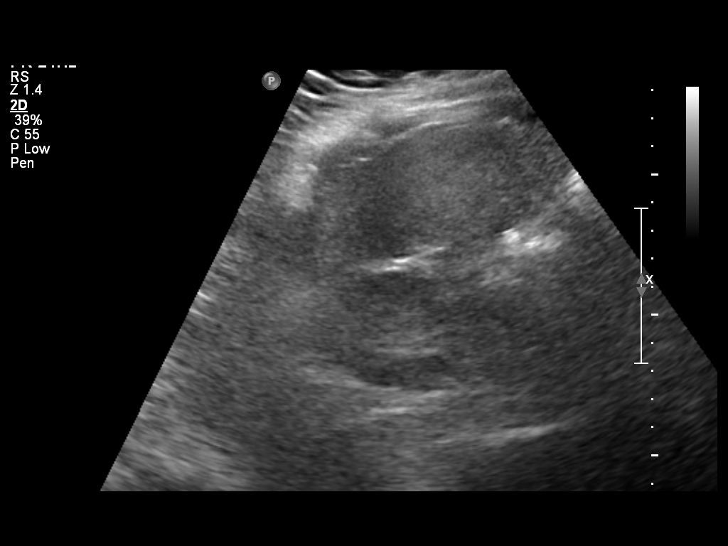
[im 9/27]
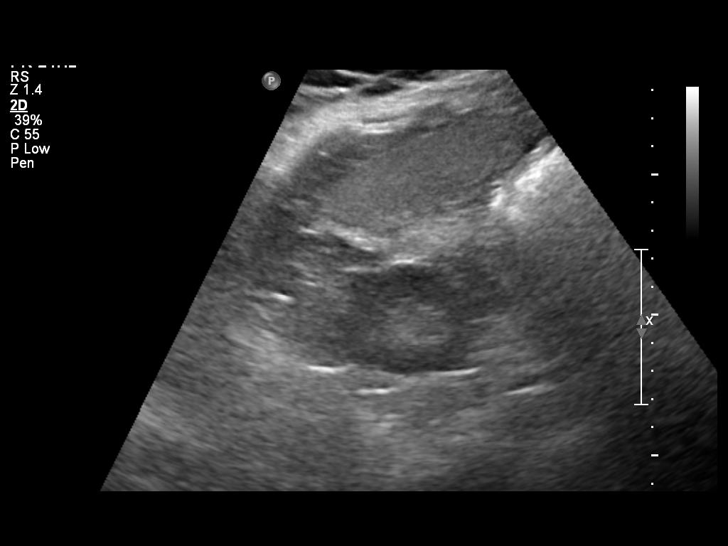
[im 10/27]
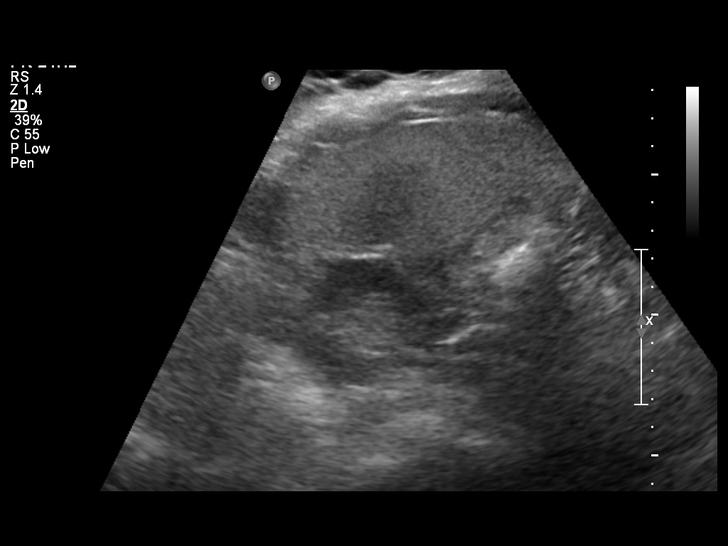
[im 12/27]
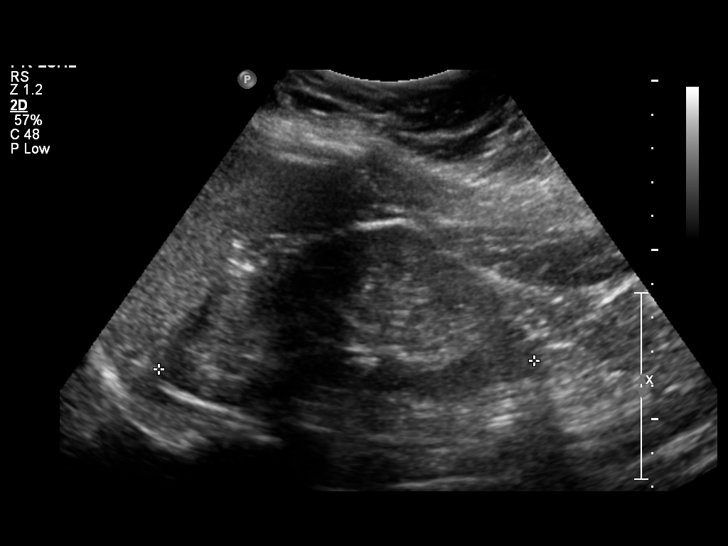
[im 15/27]
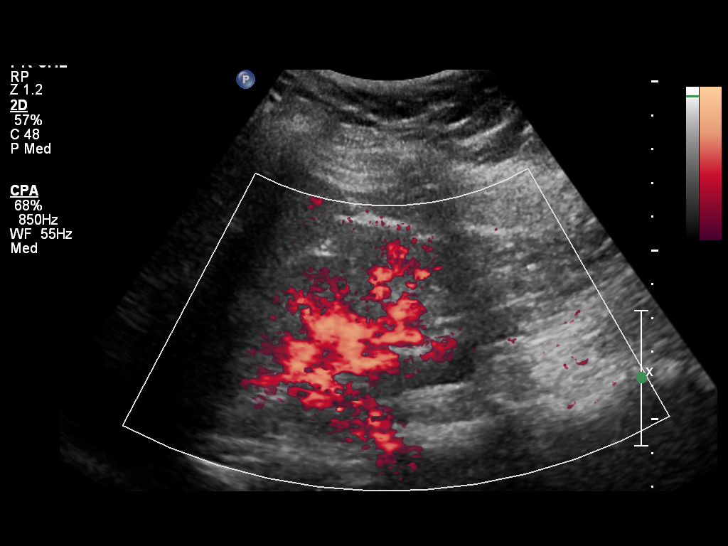
[im 17/27]
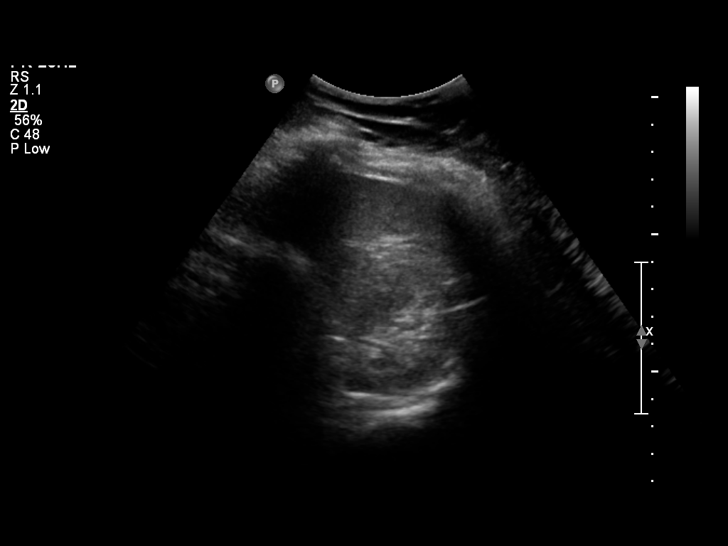
[im 18/27]
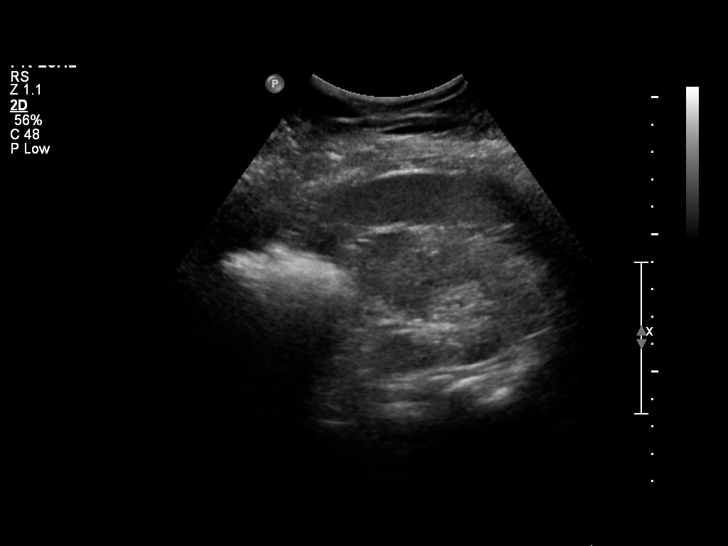
[im 20/27]
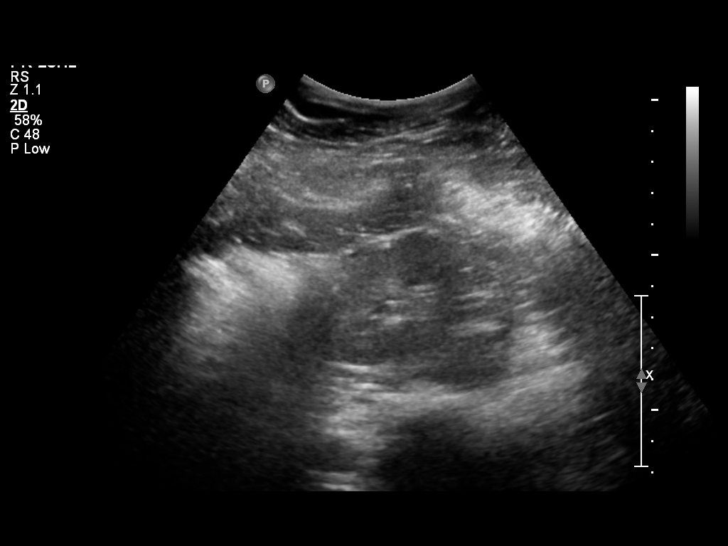
[im 22/27]
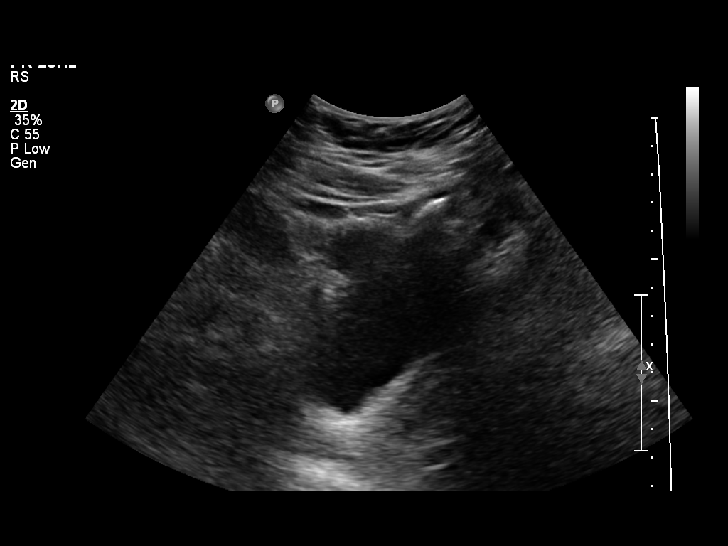
[im 24/27]
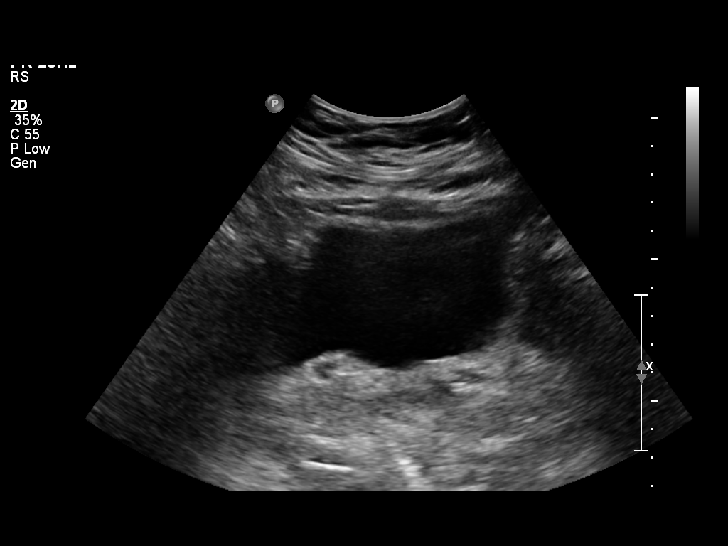
[im 27/27]
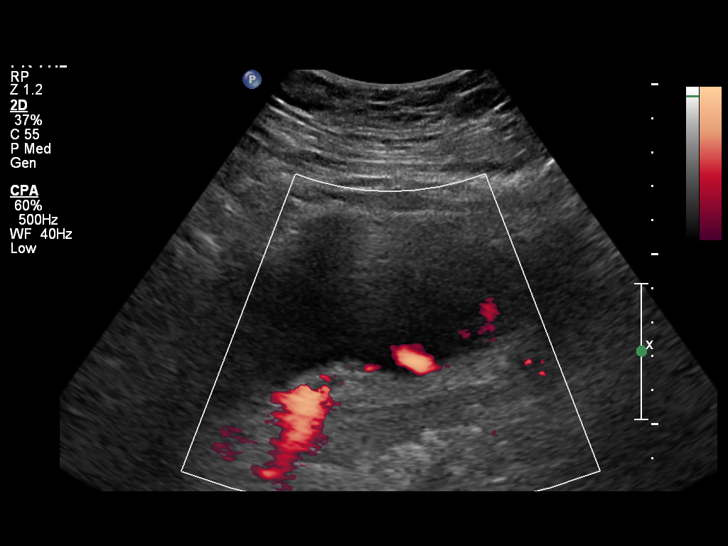

[14 of 25 positions shown; findings below may reference images not displayed]

FINDINGS: Right Kidney:  Normal in size and parenchymal echogenicity.  No
evidence of mass or hydronephrosis.

Left Kidney:  Normal in size and parenchymal echogenicity.  No
evidence of mass or hydronephrosis.

Bladder:  Appears normal for degree of bladder distention.

Diffuse fatty infiltration of the liver is incidentally noted.
IMPRESSION: Normal renal ultrasound.

## 2013-11-20 IMAGING — US US BIOPSY
1 series · 8 of 8 positions shown · non-contrast
Comparison: none

CLINICAL DATA: Persistent postpartum proteinuria

[Series 1: us biopsy · 0.30mm/px · 8 of 8 slices shown]
[im 1/8]
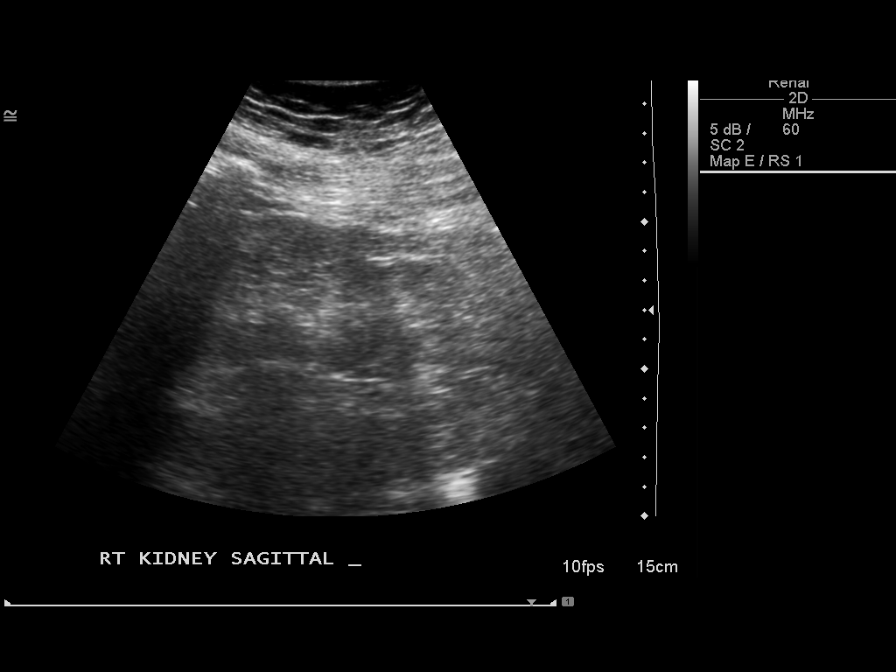
[im 2/8]
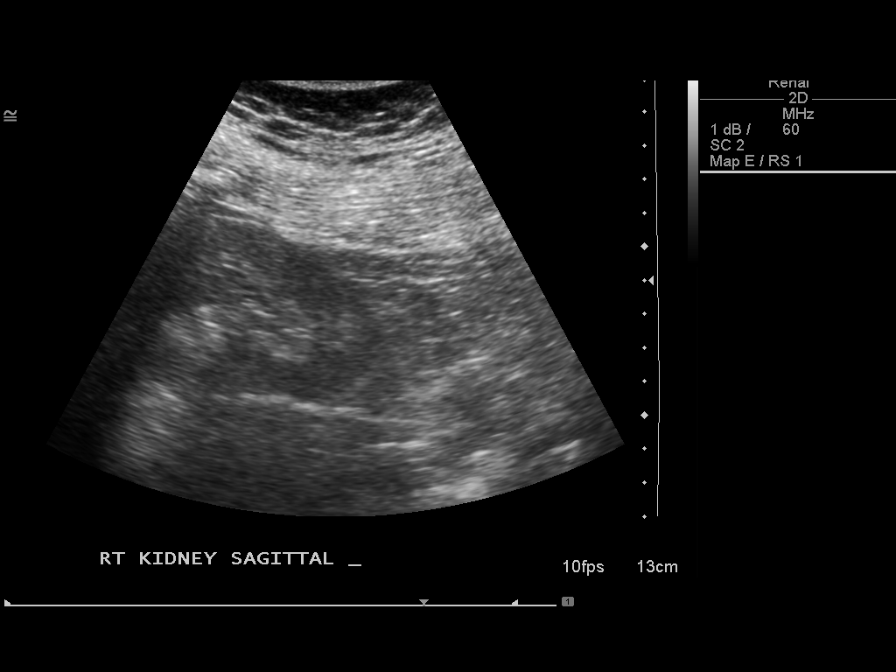
[im 3/8]
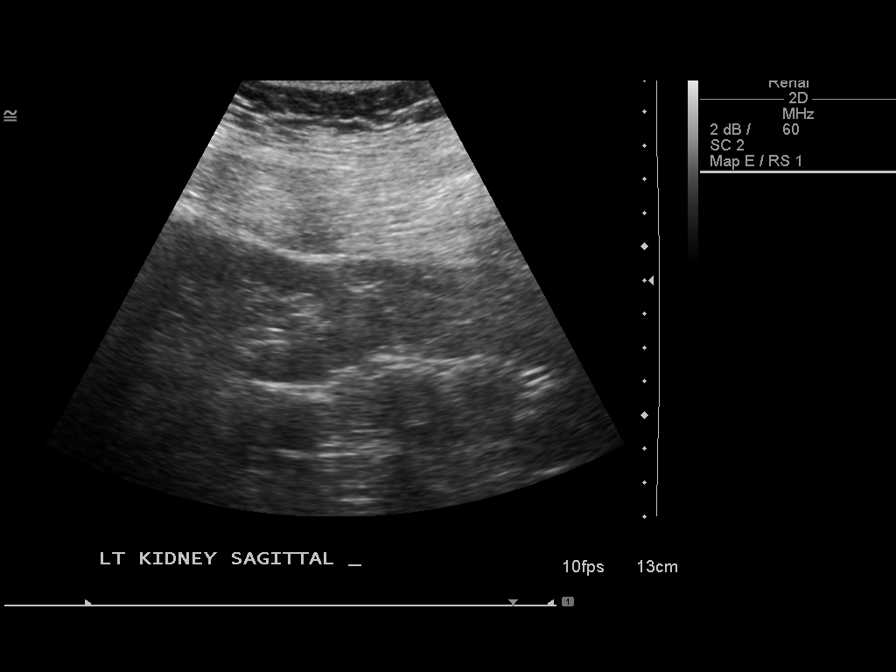
[im 4/8]
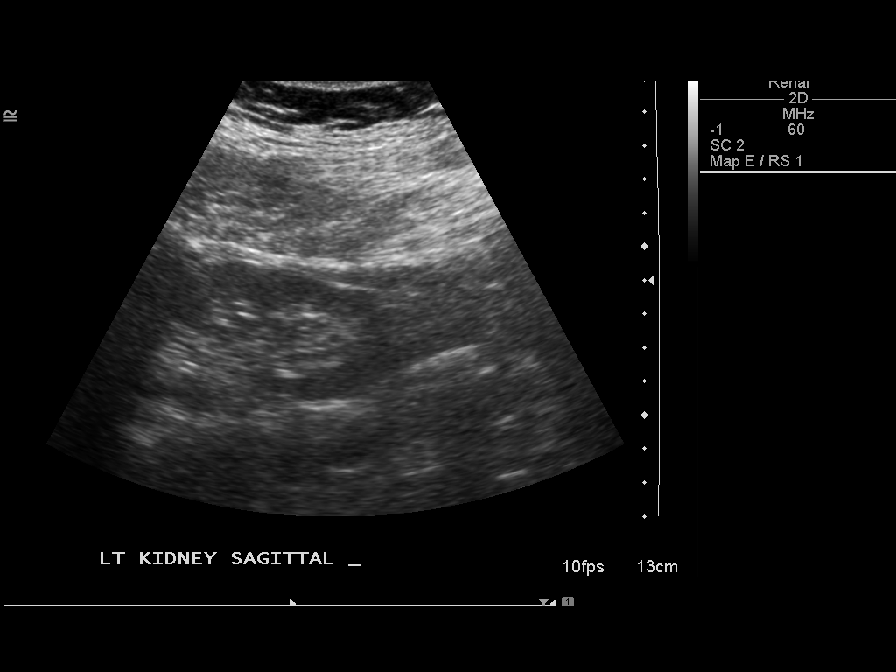
[im 5/8]
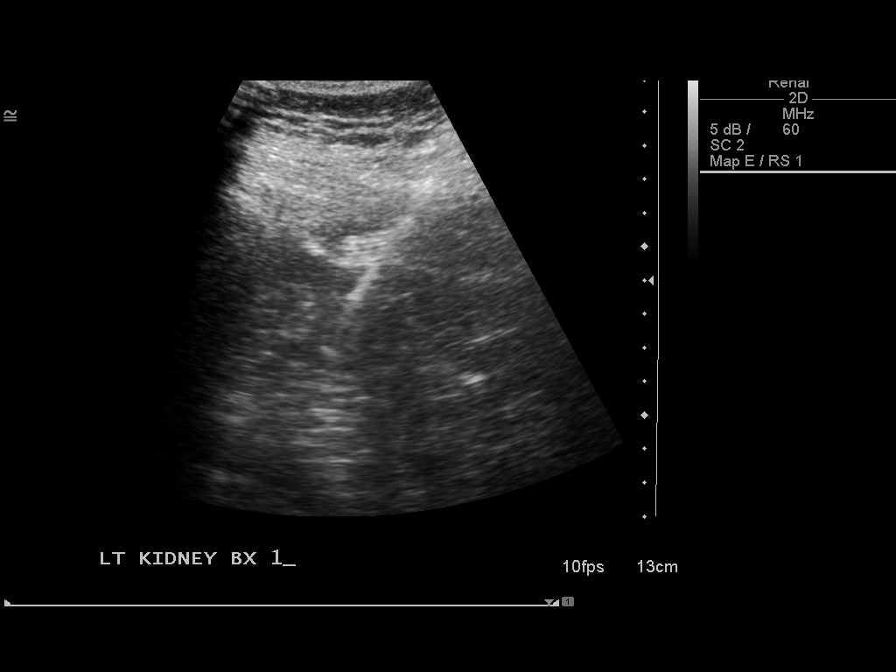
[im 6/8]
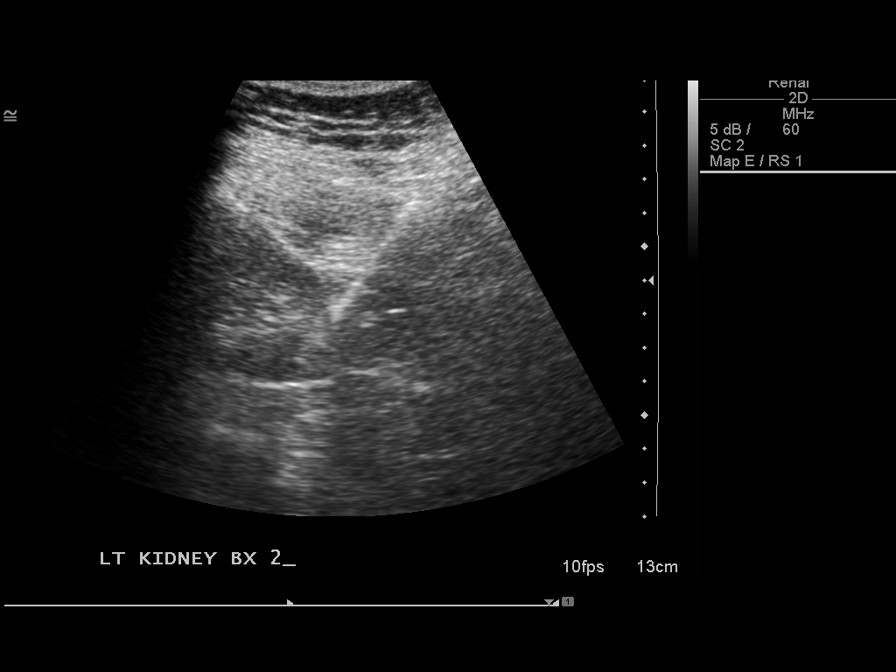
[im 7/8]
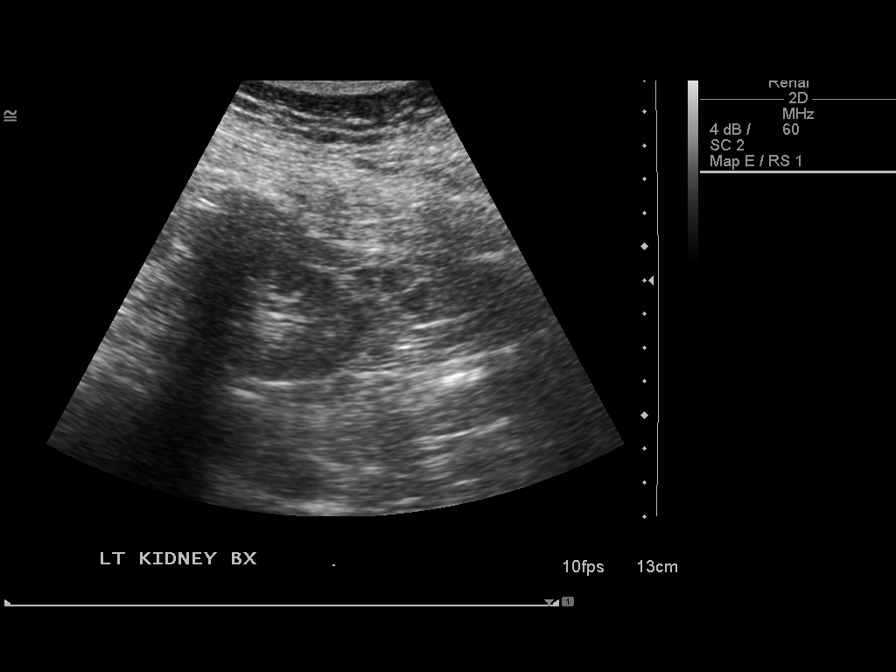
[im 8/8]
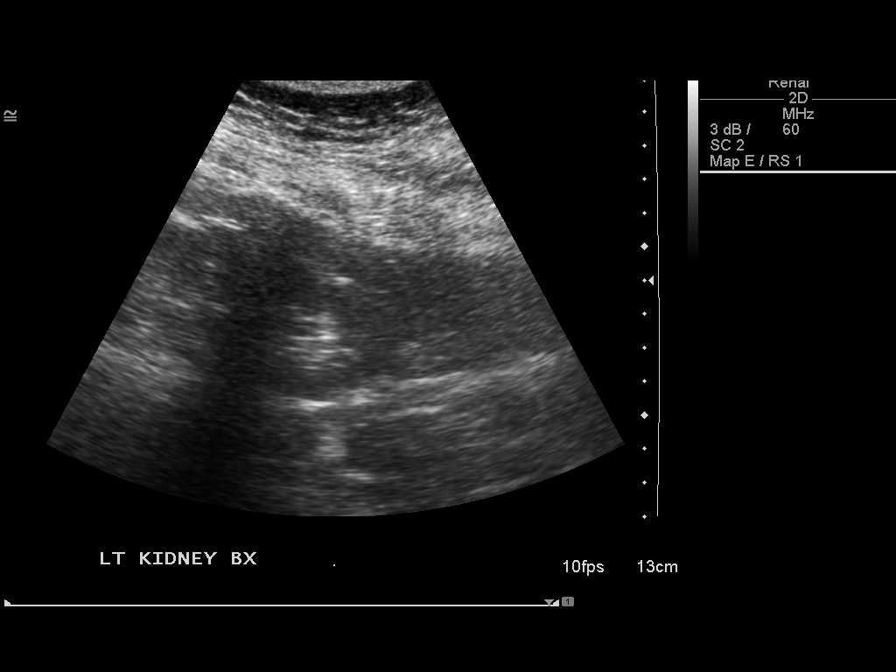

[8 of 8 positions shown; findings below may reference images not displayed]

ULTRASOUND LEFT RENAL LOWER POLE CORTEX CORE BIOPSY

Date:  04/22/2012 [DATE]

Radiologist:  Aisha Haase, M.D.

Medications:  1.5 mg Versed, 75 mcg Fentanyl

Guidance:  Ultrasound

Fluoroscopy time:  None.

Sedation time:  15 minutes

Contrast volume:  None.

Complications:  No immediate

PROCEDURE/FINDINGS:

Informed consent was obtained from the patient following
explanation of the procedure, risks, benefits and alternatives.
The patient understands, agrees and consents for the procedure.
All questions were addressed.  A time out was performed.

Maximal barrier sterile technique utilized including caps, mask,
sterile gowns, sterile gloves, large sterile drape, hand hygiene,
and betadine

The patient was positioned prone.  Left kidney lower pole cortex
was localized.  Under sterile conditions and local anesthesia, a 16
gauge 15 cm access needle was advanced percutaneously under direct
ultrasound guidance to the left kidney lower pole cortex.  2  16
gauge core biopsies were obtained.  These were placed in saline.
Review by pathology demonstrated adequate core sampling.  Needles
removed.  No immediate complication.  The patient tolerated the
procedure well.
IMPRESSION: Successful left renal lower pole cortex 16 gauge core biopsy (2)

## 2014-05-15 ENCOUNTER — Ambulatory Visit: Payer: BC Managed Care – PPO | Admitting: Sports Medicine

## 2014-05-15 DIAGNOSIS — Z0289 Encounter for other administrative examinations: Secondary | ICD-10-CM

## 2014-05-18 ENCOUNTER — Encounter (HOSPITAL_COMMUNITY): Payer: Self-pay | Admitting: *Deleted

## 2014-07-03 ENCOUNTER — Encounter (HOSPITAL_COMMUNITY): Payer: Self-pay | Admitting: *Deleted

## 2014-08-11 ENCOUNTER — Ambulatory Visit: Payer: BC Managed Care – PPO | Admitting: Family Medicine

## 2014-08-11 DIAGNOSIS — Z0289 Encounter for other administrative examinations: Secondary | ICD-10-CM

## 2014-08-14 ENCOUNTER — Ambulatory Visit: Payer: BC Managed Care – PPO | Admitting: Family Medicine

## 2014-11-14 ENCOUNTER — Encounter: Payer: Self-pay | Admitting: Physician Assistant

## 2014-11-14 ENCOUNTER — Ambulatory Visit (INDEPENDENT_AMBULATORY_CARE_PROVIDER_SITE_OTHER): Payer: PRIVATE HEALTH INSURANCE | Admitting: Physician Assistant

## 2014-11-14 VITALS — BP 112/69 | HR 65 | Ht 62.0 in | Wt 223.0 lb

## 2014-11-14 DIAGNOSIS — R809 Proteinuria, unspecified: Secondary | ICD-10-CM | POA: Insufficient documentation

## 2014-11-14 DIAGNOSIS — N189 Chronic kidney disease, unspecified: Secondary | ICD-10-CM | POA: Insufficient documentation

## 2014-11-14 DIAGNOSIS — M2550 Pain in unspecified joint: Secondary | ICD-10-CM

## 2014-11-14 DIAGNOSIS — N183 Chronic kidney disease, stage 3 unspecified: Secondary | ICD-10-CM | POA: Insufficient documentation

## 2014-11-14 DIAGNOSIS — N181 Chronic kidney disease, stage 1: Secondary | ICD-10-CM

## 2014-11-14 DIAGNOSIS — L83 Acanthosis nigricans: Secondary | ICD-10-CM

## 2014-11-14 DIAGNOSIS — I10 Essential (primary) hypertension: Secondary | ICD-10-CM | POA: Diagnosis not present

## 2014-11-14 DIAGNOSIS — R1030 Lower abdominal pain, unspecified: Secondary | ICD-10-CM | POA: Diagnosis not present

## 2014-11-14 DIAGNOSIS — B372 Candidiasis of skin and nail: Secondary | ICD-10-CM

## 2014-11-14 DIAGNOSIS — N051 Unspecified nephritic syndrome with focal and segmental glomerular lesions: Secondary | ICD-10-CM

## 2014-11-14 DIAGNOSIS — N032 Chronic nephritic syndrome with diffuse membranous glomerulonephritis: Secondary | ICD-10-CM

## 2014-11-14 DIAGNOSIS — N2581 Secondary hyperparathyroidism of renal origin: Secondary | ICD-10-CM

## 2014-11-14 DIAGNOSIS — D631 Anemia in chronic kidney disease: Secondary | ICD-10-CM

## 2014-11-14 HISTORY — DX: Chronic kidney disease, stage 3 unspecified: N18.30

## 2014-11-14 HISTORY — DX: Unspecified nephritic syndrome with focal and segmental glomerular lesions: N05.1

## 2014-11-14 LAB — COMPLETE METABOLIC PANEL WITH GFR
ALK PHOS: 72 U/L (ref 39–117)
ALT: 59 U/L — ABNORMAL HIGH (ref 0–35)
AST: 32 U/L (ref 0–37)
Albumin: 3.9 g/dL (ref 3.5–5.2)
BUN: 25 mg/dL — AB (ref 6–23)
CALCIUM: 9.5 mg/dL (ref 8.4–10.5)
CHLORIDE: 107 meq/L (ref 96–112)
CO2: 22 mEq/L (ref 19–32)
CREATININE: 1.48 mg/dL — AB (ref 0.50–1.10)
GFR, EST AFRICAN AMERICAN: 56 mL/min — AB
GFR, EST NON AFRICAN AMERICAN: 48 mL/min — AB
Glucose, Bld: 103 mg/dL — ABNORMAL HIGH (ref 70–99)
Potassium: 4.9 mEq/L (ref 3.5–5.3)
Sodium: 139 mEq/L (ref 135–145)
Total Bilirubin: 0.4 mg/dL (ref 0.2–1.2)
Total Protein: 7.4 g/dL (ref 6.0–8.3)

## 2014-11-14 LAB — WET PREP FOR TRICH, YEAST, CLUE
CLUE CELLS WET PREP: NONE SEEN
TRICH WET PREP: NONE SEEN
YEAST WET PREP: NONE SEEN

## 2014-11-14 LAB — RHEUMATOID FACTOR: Rhuematoid fact SerPl-aCnc: <10 IU/mL (ref <=14)

## 2014-11-14 LAB — POCT URINALYSIS DIPSTICK
BILIRUBIN UA: NEGATIVE
Glucose, UA: NEGATIVE
Ketones, UA: NEGATIVE
Leukocytes, UA: NEGATIVE
Nitrite, UA: NEGATIVE
Protein, UA: 100
RBC UA: NEGATIVE
SPEC GRAV UA: 1.015
UROBILINOGEN UA: 0.2
pH, UA: 5.5

## 2014-11-14 LAB — C-REACTIVE PROTEIN: CRP: 1.1 mg/dL — AB (ref <0.60)

## 2014-11-14 MED ORDER — CLOTRIMAZOLE 1 % EX CREA: 1.0000 "application " | TOPICAL_CREAM | Freq: Two times a day (BID) | CUTANEOUS | Status: DC

## 2014-11-14 MED ORDER — TRIAMCINOLONE ACETONIDE 0.1 % EX CREA: 1.0000 "application " | TOPICAL_CREAM | Freq: Two times a day (BID) | CUTANEOUS | Status: DC

## 2014-11-14 MED ORDER — FLUCONAZOLE 150 MG PO TABS: 150.0000 mg | ORAL_TABLET | Freq: Once | ORAL | Status: DC

## 2014-11-14 NOTE — Patient Instructions (Addendum)
Intertrigo Intertrigo is a skin condition that occurs in between folds of skin in places on the body that rub together a lot and do not get much ventilation. It is caused by heat, moisture, friction, sweat retention, and lack of air circulation, which produces red, irritated patches and, sometimes, scaling or drainage. People who have diabetes, who are obese, or who have treatment with antibiotics are at increased risk for intertrigo. The most common sites for intertrigo to occur include:  The groin.  The breasts.  The armpits.  Folds of abdominal skin.  Webbed spaces between the fingers or toes. Intertrigo may be aggravated by:  Sweat.  Feces.  Yeast or bacteria that are present near skin folds.  Urine.  Vaginal discharge. HOME CARE INSTRUCTIONS  The following steps can be taken to reduce friction and keep the affected area cool and dry:  Expose skin folds to the air.  Keep deep skin folds separated with cotton or linen cloth. Avoid tight fitting clothing that could cause chafing.  Wear open-toed shoes or sandals to help reduce moisture between the toes.  Apply absorbent powders to affected areas as directed by your caregiver.  Apply over-the-counter barrier pastes, such as zinc oxide, as directed by your caregiver.  If you develop a fungal infection in the affected area, your caregiver may have you use antifungal creams. SEEK MEDICAL CARE IF:   The rash is not improving after 1 week of treatment.  The rash is getting worse (more red, more swollen, more painful, or spreading).  You have a fever or chills. MAKE SURE YOU:   Understand these instructions.  Will watch your condition.  Will get help right away if you are not doing well or get worse. Document Released: 08/18/2005 Document Revised: 11/10/2011 Document Reviewed: 01/31/2010 ExitCare Patient Information 2015 [employer]. This information is not intended to replace advice given to you by your health  care provider. Make sure you discuss any questions you have with your health care provider.  

## 2014-11-14 NOTE — Progress Notes (Signed)
Subjective:    Patient ID: Laura Robinson, female    DOB: 05/05/1986, 29 y.o.   MRN: [id number]  HPI  Pt presents to the clinic to establish care.   ... Active Ambulatory Problems    Diagnosis Date Noted  . 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 Proteinuria 11/14/2014  . CKD (chronic kidney disease), stage III 11/14/2014  . Anemia associated with chronic renal failure 11/14/2014  . Secondary hyperparathyroidism, renal 11/14/2014  . Essential hypertension 11/14/2014   Resolved Ambulatory Problems    Diagnosis Date Noted  . No Resolved Ambulatory Problems   Past Medical History  Diagnosis Date  . Obesity   . Pregnancy induced hypertension   . Preterm labor   . Gestational diabetes mellitus in childbirth, diet controlled    .Marland Kitchen Family History  Problem Relation Age of Onset  . Asthma Brother   . Hypertension Brother   . Kidney disease Brother   . Cancer Maternal Grandmother   . Hypertension Maternal Grandmother   . Anesthesia problems Neg Hx   . Diabetes Father   . Hypertension Father   . Hypertension Maternal Grandfather   . Hypertension Paternal Grandmother   . Alcohol abuse Paternal Grandfather   . Hypertension Paternal Grandfather    .Marland Kitchen History   Social History  . Marital Status: Married    Spouse Name: N/A  . Number of Children: N/A  . Years of Education: N/A   Occupational History  . Not on file.   Social History Main Topics  . Smoking status: Former Smoker -- 0.25 packs/day for 4 years    Types: Cigarettes    Quit date: 07/14/2011  . Smokeless tobacco: Never Used  . Alcohol Use: No     Comment: prior to pregnancy  . Drug Use: No  . Sexual Activity: Yes   Other Topics Concern  . Not on file   Social History Narrative    Pt has multiple concerns today. Will start with a few most urgent and follow up for the rest.   She has a lot of vaginal itching. Denies any active discharge. No urinary symptoms. Tried OTC ointent for yeast  with no improvement.   She also has scaly dry skin around her nose and behind her ears. Very itchy. Not tried anything.   She has multiple joint pain all over. Seems to rotate over entire body. She has gained a lot of weight and wonders if that is it.     Review of Systems  All other systems reviewed and are negative.      Objective:   Physical Exam  Constitutional: She is oriented to person, place, and time. She appears well-developed and well-nourished.  Obese.   HENT:  Head: Normocephalic and atraumatic.  Cardiovascular: Normal rate, regular rhythm and normal heart sounds.   Pulmonary/Chest: Effort normal and breath sounds normal.  Genitourinary: Vagina normal. No vaginal discharge found.  hyperpigemented skin external vagina into inguinal folds. No lesions or vessicles. No abscess.   Neurological: She is alert and oriented to person, place, and time.  Skin: Skin is dry.  Around neck some velvetly hyperpigemented skin changes started.   Scaly skin around nasal folds and behinds ears.   Psychiatric: She has a normal mood and affect. Her behavior is normal.          Assessment & Plan:  FSGS- [place] [facility] manages.   Acanthosis nigrans- will screen for diabetes and insulin resistence.   canidia of intertrigo areas- diflucan twice  with antifungal cream. HO given. Encouraged to keep area dry. Follow up if not improving. Wet prep ordered.   Atopic dermatitis behind ears and nasal folds- triamcinolone given bid as needed for itching.   Multiple joint pain- will get some autoimmune preliminary labs and consider referral.

## 2014-11-15 LAB — VITAMIN D 25 HYDROXY (VIT D DEFICIENCY, FRACTURES): VIT D 25 HYDROXY: 22 ng/mL — AB (ref 30–100)

## 2014-11-15 LAB — SEDIMENTATION RATE: SED RATE: 30 mm/h — AB (ref 0–20)

## 2014-11-15 LAB — HEMOGLOBIN A1C
Hgb A1c MFr Bld: 5.5 % (ref <5.7)
Mean Plasma Glucose: 111 mg/dL (ref <117)

## 2014-11-15 LAB — ANTI-NUCLEAR AB-TITER (ANA TITER): ANA TITER 1: NEGATIVE (ref <1:40)

## 2014-11-15 LAB — ANA: ANA: POSITIVE — AB

## 2014-11-15 LAB — CYCLIC CITRUL PEPTIDE ANTIBODY, IGG

## 2014-11-16 LAB — URINE CULTURE

## 2014-11-17 ENCOUNTER — Telehealth: Payer: Self-pay | Admitting: *Deleted

## 2014-11-17 ENCOUNTER — Other Ambulatory Visit: Payer: Self-pay | Admitting: Physician Assistant

## 2014-11-17 DIAGNOSIS — R768 Other specified abnormal immunological findings in serum: Secondary | ICD-10-CM | POA: Insufficient documentation

## 2014-11-17 DIAGNOSIS — M2550 Pain in unspecified joint: Secondary | ICD-10-CM

## 2014-11-17 DIAGNOSIS — R7982 Elevated C-reactive protein (CRP): Secondary | ICD-10-CM

## 2014-11-17 DIAGNOSIS — R7689 Other specified abnormal immunological findings in serum: Secondary | ICD-10-CM

## 2014-11-17 NOTE — Telephone Encounter (Signed)
-----   Message from Donella Stade, [place] sent at 11/17/2014 10:06 AM EDT ----- Call pt: RF factor negative. Inflammation rate and ANA elevated. Will make referral to rheumatology. Vitamin D low start 1000units daily OtC. A!C just out of pre-diabetes range which is good. However working on weight loss and good diet will help keep it there.

## 2014-11-17 NOTE — Telephone Encounter (Signed)
Patient called  In to get her results . And she didn't understand a RF & ANA. I asked Dr Madilyn Fireman what are the test for.  She then started to become a little disturbed about the ANA result. Wanting to know if she does have Lupus or not.  She may be calling back to speak with Jade on Monday.

## 2014-11-20 NOTE — Telephone Encounter (Signed)
I am willing to call pt if needed to be discussed. These test are very non-specific that is why we are making referral to rheumatology for full auto-immune work up. No definite diagnosis at this point.

## 2014-12-12 ENCOUNTER — Ambulatory Visit: Payer: PRIVATE HEALTH INSURANCE | Admitting: Physician Assistant

## 2015-03-27 ENCOUNTER — Encounter: Payer: Self-pay | Admitting: Sports Medicine

## 2015-03-27 ENCOUNTER — Ambulatory Visit (INDEPENDENT_AMBULATORY_CARE_PROVIDER_SITE_OTHER): Payer: PRIVATE HEALTH INSURANCE | Admitting: Sports Medicine

## 2015-03-27 VITALS — BP 109/75 | HR 78 | Ht 62.0 in | Wt 197.0 lb

## 2015-03-27 DIAGNOSIS — R319 Hematuria, unspecified: Secondary | ICD-10-CM | POA: Diagnosis not present

## 2015-03-27 DIAGNOSIS — K921 Melena: Secondary | ICD-10-CM | POA: Diagnosis not present

## 2015-03-27 MED ORDER — DOCUSATE SODIUM 100 MG PO CAPS: 100.0000 mg | ORAL_CAPSULE | Freq: Three times a day (TID) | ORAL | Status: DC | PRN

## 2015-03-27 MED ORDER — WITCH HAZEL-GLYCERIN EX PADS: 1.0000 "application " | MEDICATED_PAD | CUTANEOUS | Status: DC | PRN

## 2015-03-27 MED ORDER — HYDROCORTISONE ACETATE 25 MG RE SUPP: 25.0000 mg | Freq: Two times a day (BID) | RECTAL | Status: DC | PRN

## 2015-03-27 NOTE — Assessment & Plan Note (Signed)
Patient prefers somewhat of a minimalistic approach. Suppositories, stool softeners, witch hazel wipes. Referral gastroenterology, she did have some pain in the left lower quadrant which certainly could represent an element of diverticulitis. We are going to treat her conservatively at first empirically for painful external hemorrhoids, as she does again desire a conservative approach we can consider a CT scan looking for diverticulosis at a future date. If still painful at her follow-up visit I will do an anoscopy looking for a fissure. She declined this today.

## 2015-03-27 NOTE — Progress Notes (Signed)
  Subjective:    CC: Hematochezia  HPI:  This is a pleasant 29 year old female, she has a history of bleeding hemorrhoids during her pregnancy, more recently she's noted some bright red blood on the toilet paper, with minimal pain during stooling. Symptoms are mild, persistent. She only has minimal left lower quadrant pain. Her boss at work was recently diagnosed with colon cancer in his 39s, and she is reasonably worried, she understands that she is at a low risk considering her age. No symptoms of obstruction. No nausea, vomiting. She does desire a minimalistic approach, and would like to avoid a rectal exam today if possible and any advanced imaging. No fevers, chills, night sweats, weight loss. No change in the quality of her stools.  Hematuria: Was recently seen in a care, diagnosed with urinary tract infection, she did have some hematuria, but she has not yet taken her antibiotics.  Past medical history, Surgical history, Family history not pertinant except as noted below, Social history, Allergies, and medications have been entered into the medical record, reviewed, and no changes needed.   Review of Systems: No fevers, chills, night sweats, weight loss, chest pain, or shortness of breath.   Objective:    General: Well Developed, well nourished, and in no acute distress.  Neuro: Alert and oriented x3, extra-ocular muscles intact, sensation grossly intact.  HEENT: Normocephalic, atraumatic, pupils equal round reactive to light, neck supple, no masses, no lymphadenopathy, thyroid nonpalpable.  Skin: Warm and dry, no rashes. Cardiac: Regular rate and rhythm, no murmurs rubs or gallops, no lower extremity edema.  Respiratory: Clear to auscultation bilaterally. Not using accessory muscles, speaking in full sentences. Abdomen: Soft, minimally tender in the left lower quadrant without rebound tenderness or guarding, nondistended, normal bowel sounds. Rectal: Deferred per patient  request.  Impression and Recommendations:

## 2015-03-27 NOTE — Assessment & Plan Note (Signed)
Focal segmental glomerulosclerosis on kidney biopsy. She did recently have a UTI diagnosed in urgent care yesterday and has not yet started antibiotics. She will finish this course before we proceed further with working up hematuria.

## 2015-04-25 ENCOUNTER — Ambulatory Visit: Payer: PRIVATE HEALTH INSURANCE | Admitting: Physician Assistant

## 2015-06-20 ENCOUNTER — Ambulatory Visit: Payer: PRIVATE HEALTH INSURANCE | Admitting: Physician Assistant

## 2015-06-20 ENCOUNTER — Other Ambulatory Visit: Payer: Self-pay | Admitting: Physician Assistant

## 2015-06-20 ENCOUNTER — Telehealth: Payer: Self-pay | Admitting: Physician Assistant

## 2015-06-20 DIAGNOSIS — R7982 Elevated C-reactive protein (CRP): Secondary | ICD-10-CM

## 2015-06-20 DIAGNOSIS — R768 Other specified abnormal immunological findings in serum: Secondary | ICD-10-CM

## 2015-06-20 DIAGNOSIS — R7689 Other specified abnormal immunological findings in serum: Secondary | ICD-10-CM

## 2015-06-20 DIAGNOSIS — M2550 Pain in unspecified joint: Secondary | ICD-10-CM

## 2015-06-20 NOTE — Telephone Encounter (Signed)
Patient called today and stated they were ready to go to the rheumatologist and wanted [place] to send a referral to Dr. Aundria Rud in [place] if this is ok can you please place the referral for me. - CF

## 2015-06-21 ENCOUNTER — Ambulatory Visit: Payer: PRIVATE HEALTH INSURANCE | Admitting: Physician Assistant

## 2015-06-26 ENCOUNTER — Ambulatory Visit (INDEPENDENT_AMBULATORY_CARE_PROVIDER_SITE_OTHER): Payer: PRIVATE HEALTH INSURANCE | Admitting: Physician Assistant

## 2015-06-26 ENCOUNTER — Encounter: Payer: Self-pay | Admitting: Physician Assistant

## 2015-06-26 VITALS — BP 122/104 | HR 81 | Ht 62.0 in | Wt 210.0 lb

## 2015-06-26 DIAGNOSIS — L659 Nonscarring hair loss, unspecified: Secondary | ICD-10-CM | POA: Diagnosis not present

## 2015-06-26 DIAGNOSIS — J029 Acute pharyngitis, unspecified: Secondary | ICD-10-CM | POA: Diagnosis not present

## 2015-06-26 LAB — COMPREHENSIVE METABOLIC PANEL
Albumin: 4.3
BUN: 21
CALCIUM: 9.5 mg/dL
CHLORIDE: 104 mmol/L
CO2: 16 mmol/L
CREATININE: 1.55
EGFR (Non-African Amer.): 45
Glucose: 111
MAGNESIUM: 1.7
PHOSPHORUS: 3.2
Potassium: 4.6 mmol/L
Protein Creatinine Ratio: 1577
Sodium: 141

## 2015-06-26 LAB — CBC
HCT: 41 % (ref 36.0–46.0)
HEMOGLOBIN: 13.5 g/dL (ref 12.0–15.0)
MCH: 27.8 pg (ref 26.0–34.0)
MCHC: 32.9 g/dL (ref 30.0–36.0)
MCV: 84.4 fL (ref 78.0–100.0)
MPV: 9.2 fL (ref 8.6–12.4)
PLATELETS: 355 10*3/uL (ref 150–400)
RBC: 4.86 MIL/uL (ref 3.87–5.11)
RDW: 13.7 % (ref 11.5–15.5)
WBC: 7.2 10*3/uL (ref 4.0–10.5)

## 2015-06-26 LAB — VITAMIN B12: Vitamin B-12: 398 pg/mL (ref 211–911)

## 2015-06-26 LAB — THYROID PANEL WITH TSH
Free Thyroxine Index: 2.7 (ref 1.4–3.8)
T3 UPTAKE: 28 % (ref 22–35)
T4 TOTAL: 9.7 ug/dL (ref 4.5–12.0)
TSH: 2.064 u[IU]/mL (ref 0.350–4.500)

## 2015-06-26 NOTE — Patient Instructions (Signed)
Biotin 790mcg a day for hair and nail strength.  ST discussed zyrtec D at bedtime honey cough lozengers.   Alopecia Areata Alopecia areata is a type of hair loss. If you have this condition, you may lose hair on your scalp in patches. In some cases, you may lose all the hair on your scalp (alopecia totalis) or all the hair from your face and body (alopecia universalis).  Alopecia areata is an autoimmune disease. This means your body's defense system (immune system) mistakes normal parts of your body for germs or other things that can make you sick. When you have alopecia areata, your immune system attacks your hair follicles.  Alopecia areata often starts during childhood but can occur at any age. Alopecia areata is not a danger to your health but can be stressful.  CAUSES  The cause of alopecia areata is unknown.  RISK FACTORS You may be at higher risk of alopecia areata if you:   Have a family history of alopecia.  Have a family history of another autoimmune disease, including type 1 diabetes and rheumatoid arthritis. SIGNS AND SYMPTOMS Signs of alopecia areata may include:  Loss of scalp hair in small, round patches. These may be about the size of a quarter.  Loss of all hair on your scalp.  Loss of eyebrow hair, facial hair, or the hair inside your nose (nasal hair).  Hair loss over your entire body. DIAGNOSIS  Alopecia areata may be diagnosed by:  Medical history and physical exam.  Taking a sample of hair to check under a microscope.  Taking a small piece of skin (biopsy) to examine under a microscope.  Blood tests to rule out other autoimmune diseases. TREATMENT  There is no cure for alopecia areata, but the disease often goes away over time. You will not lose the ability to regrow hair. Some medicines may help your hair regrow more quickly. These include:  Corticosteroids. These block inflammation caused by your immune system. You may get this medicine as a lotion for  your skin or as an injection.  Minoxidil. This is a hair growth medicine you can use in areas of hair loss.  Anthralin. This is a medicine for a skin inflammation called psoriasis that may also help alopecia.  Diphencyprone. This medicine is applied to your skin and may stimulate hair growth. HOME CARE INSTRUCTIONS  Use sunscreen or cover your head when outdoors.  Take medicines only as directed by your health care provider.  If you have lost your eyebrows, wear sunglasses outside to keep dust out of your eyes.  If you have lost hair inside your nose, wear a kerchief over your face or apply ointment to the inside of your nose. This keeps out dust and other irritants.  Keep all follow-up visits as directed by your health care provider. This is important. SEEK MEDICAL CARE IF:  Your symptoms change.  You have new symptoms.  You have a reaction to your medicines.  You are struggling emotionally.   This information is not intended to replace advice given to you by your health care provider. Make sure you discuss any questions you have with your health care provider.   Document Released: 03/22/2004 Document Revised: 09/08/2014 Document Reviewed: 11/07/2013 Elsevier Interactive Patient Education [employer].

## 2015-06-26 NOTE — Progress Notes (Addendum)
Laura Robinson is a 29 y.o. female who presents to [facility]  today for hair loss. Laura Robinson states that she noticed her hair falling out in clumps in the shower 1.5 months ago. She states that this has never happened before and it is not happening anywhere else on her body besides her scalp. Additionally she states that she has been experiencing fatigue, severe joint pain and problems with memory and concentration. She denies family history of hair loss, use of perms or dyes, dandruff problems, cold intolerance, dry skin, constipation or peripheral edema.   Patient also reports that she has begun having a sore throat, runny nose and sometimes a cough in the past several days. She denies seasonal allergies and has not tried any OTC medication.    Past Medical History  Diagnosis Date  . Obesity   . Pregnancy induced hypertension   . Preterm labor   . Gestational diabetes mellitus in childbirth, diet controlled    Past Surgical History  Procedure Laterality Date  . No past surgeries     Social History  Substance Use Topics  . Smoking status: Former Smoker -- 0.25 packs/day for 4 years    Types: Cigarettes    Quit date: 07/14/2011  . Smokeless tobacco: Never Used  . Alcohol Use: No     Comment: prior to pregnancy   family history includes Alcohol abuse in her paternal grandfather; Asthma in her brother; Cancer in her maternal grandmother; Diabetes in her father; Hypertension in her brother, father, maternal grandfather, maternal grandmother, paternal grandfather, and paternal grandmother; Kidney disease in her brother. There is no history of Anesthesia problems.  ROS as above Medications: Current Outpatient Prescriptions  Medication Sig Dispense Refill  . telmisartan (MICARDIS) 20 MG tablet Take 20 mg by mouth daily.    . Vitamin D, Ergocalciferol, (DRISDOL) 50000 UNITS CAPS capsule Take 50,000 Units by mouth every 7 (seven) days.    Marland Kitchen docusate  sodium (COLACE) 100 MG capsule Take 1 capsule (100 mg total) by mouth 3 (three) times daily as needed. (Patient not taking: Reported on 06/26/2015) 90 capsule 3  . hydrocortisone (ANUSOL-HC) 25 MG suppository Place 1 suppository (25 mg total) rectally 2 (two) times daily as needed for hemorrhoids or itching. (Patient not taking: Reported on 06/26/2015) 24 suppository 2  . witch hazel-glycerin (TUCKS) pad Apply 1 application topically as needed. (Patient not taking: Reported on 06/26/2015) 1 each 11   No current facility-administered medications for this visit.   No Known Allergies   Exam:  BP 122/104 mmHg  Pulse 81  Ht 5\' 2"  (1.575 m)  Wt 210 lb (95.255 kg)  BMI 38.40 kg/m2 Gen: WDWN female in no acute distress.  HEENT: EOMI,  MMM. No areas of apparent hair loss on scalp, several strands were removed upon moving the hair for examination. No lesions, masses or areas of tenderness noted upon palpation of the scalp. Eyebrow and eyelash hair are of normal bulk.  Lungs: Normal work of breathing. CTABL Heart: RRR no MRG Exts: Brisk capillary refill, warm and well perfused.   Assessment: 1. Hair loss based on patient description of symptoms. R/o hypothyroidism/Hasimoto's, vitamin deficiency and anemia. 2. Allergic rhinitis based on patient description of symptoms and typical time of year and presentation for seasonal allergies.   Plan: 1. Lab work ordered today (CBC, ferritin, TSH, thyroid antibodies, vitamin B12, vitamin D) to r/o possible causes of hair loss. Patient instructed to start 700 mcg biotin and iron  supplements today. Will call with lab results. Patient education provided on the possible causes of hair loss. Pt has appt with rheumatology Nov 20th to rule out other autoimmune issues and for elevated ANA.  2. Appears like there could be some PND. Discussed signs of sinusitis and bacterial infection. Patient instructed to try OTC claritin or zyrtec for symptom relief. If symptoms worsen  or do not improve within 7-10 days return to the office for re-evaluation.   Reviewed and changes made by Iran Planas PA-C

## 2015-06-27 LAB — VITAMIN D 25 HYDROXY (VIT D DEFICIENCY, FRACTURES): VIT D 25 HYDROXY: 30 ng/mL (ref 30–100)

## 2015-06-27 LAB — THYROID ANTIBODIES: Thyroperoxidase Ab SerPl-aCnc: 2 IU/mL (ref <9)

## 2015-07-25 LAB — BASIC METABOLIC PANEL
BUN: 27 mg/dL — AB (ref 4–21)
Creatinine: 1.6 mg/dL — AB (ref 0.5–1.1)
Glucose: 95 mg/dL
Potassium: 4.5 mmol/L (ref 3.4–5.3)
SODIUM: 136 mmol/L — AB (ref 137–147)

## 2015-07-25 LAB — COMPREHENSIVE METABOLIC PANEL
BUN/Creatinine Ratio: 16
CALCIUM: 9.2 mg/dL
CHLORIDE: 104 mmol/L
CO2: 22 mmol/L
GFR CALC AF AMER: 49
GFR CALC NON AF AMER: 42

## 2015-08-06 ENCOUNTER — Ambulatory Visit: Payer: PRIVATE HEALTH INSURANCE | Admitting: Physician Assistant

## 2015-08-06 ENCOUNTER — Ambulatory Visit (INDEPENDENT_AMBULATORY_CARE_PROVIDER_SITE_OTHER): Payer: PRIVATE HEALTH INSURANCE

## 2015-08-06 ENCOUNTER — Encounter: Payer: Self-pay | Admitting: Family Medicine

## 2015-08-06 ENCOUNTER — Ambulatory Visit (INDEPENDENT_AMBULATORY_CARE_PROVIDER_SITE_OTHER): Payer: PRIVATE HEALTH INSURANCE | Admitting: Family Medicine

## 2015-08-06 VITALS — BP 94/59 | HR 70 | Temp 98.7°F | Wt 219.0 lb

## 2015-08-06 DIAGNOSIS — J209 Acute bronchitis, unspecified: Secondary | ICD-10-CM | POA: Diagnosis not present

## 2015-08-06 MED ORDER — PREDNISONE 10 MG PO TABS: 30.0000 mg | ORAL_TABLET | Freq: Every day | ORAL | Status: DC

## 2015-08-06 MED ORDER — IPRATROPIUM BROMIDE 0.06 % NA SOLN: 2.0000 | Freq: Four times a day (QID) | NASAL | Status: DC

## 2015-08-06 MED ORDER — AZITHROMYCIN 250 MG PO TABS: 250.0000 mg | ORAL_TABLET | Freq: Every day | ORAL | Status: DC

## 2015-08-06 MED ORDER — PROMETHAZINE-CODEINE 6.25-10 MG/5ML PO SYRP: 5.0000 mL | ORAL_SOLUTION | Freq: Four times a day (QID) | ORAL | Status: DC | PRN

## 2015-08-06 NOTE — Patient Instructions (Signed)
Thank you for coming in today. Call or go to the emergency room if you get worse, have trouble breathing, have chest pains, or palpitations.  Do not take the cough medicine and drive.  Get your chest xray.   Acute Bronchitis Bronchitis is inflammation of the airways that extend from the windpipe into the lungs (bronchi). The inflammation often causes mucus to develop. This leads to a cough, which is the most common symptom of bronchitis.  In acute bronchitis, the condition usually develops suddenly and goes away over time, usually in a couple weeks. Smoking, allergies, and asthma can make bronchitis worse. Repeated episodes of bronchitis may cause further lung problems.  CAUSES Acute bronchitis is most often caused by the same virus that causes a cold. The virus can spread from person to person (contagious) through coughing, sneezing, and touching contaminated objects. SIGNS AND SYMPTOMS   Cough.   Fever.   Coughing up mucus.   Body aches.   Chest congestion.   Chills.   Shortness of breath.   Sore throat.  DIAGNOSIS  Acute bronchitis is usually diagnosed through a physical exam. Your health care provider will also ask you questions about your medical history. Tests, such as chest X-rays, are sometimes done to rule out other conditions.  TREATMENT  Acute bronchitis usually goes away in a couple weeks. Oftentimes, no medical treatment is necessary. Medicines are sometimes given for relief of fever or cough. Antibiotic medicines are usually not needed but may be prescribed in certain situations. In some cases, an inhaler may be recommended to help reduce shortness of breath and control the cough. A cool mist vaporizer may also be used to help thin bronchial secretions and make it easier to clear the chest.  HOME CARE INSTRUCTIONS  Get plenty of rest.   Drink enough fluids to keep your urine clear or pale yellow (unless you have a medical condition that requires fluid  restriction). Increasing fluids may help thin your respiratory secretions (sputum) and reduce chest congestion, and it will prevent dehydration.   Take medicines only as directed by your health care provider.  If you were prescribed an antibiotic medicine, finish it all even if you start to feel better.  Avoid smoking and secondhand smoke. Exposure to cigarette smoke or irritating chemicals will make bronchitis worse. If you are a smoker, consider using nicotine gum or skin patches to help control withdrawal symptoms. Quitting smoking will help your lungs heal faster.   Reduce the chances of another bout of acute bronchitis by washing your hands frequently, avoiding people with cold symptoms, and trying not to touch your hands to your mouth, nose, or eyes.   Keep all follow-up visits as directed by your health care provider.  SEEK MEDICAL CARE IF: Your symptoms do not improve after 1 week of treatment.  SEEK IMMEDIATE MEDICAL CARE IF:  You develop an increased fever or chills.   You have chest pain.   You have severe shortness of breath.  You have bloody sputum.   You develop dehydration.  You faint or repeatedly feel like you are going to pass out.  You develop repeated vomiting.  You develop a severe headache. MAKE SURE YOU:   Understand these instructions.  Will watch your condition.  Will get help right away if you are not doing well or get worse.   This information is not intended to replace advice given to you by your health care provider. Make sure you discuss any questions you have with  your health care provider.   Document Released: 09/25/2004 Document Revised: 09/08/2014 Document Reviewed: 02/08/2013 Elsevier Interactive Patient Education [employer].

## 2015-08-06 NOTE — Progress Notes (Signed)
Laura Robinson is a 29 y.o. female who presents to [facility]  today for cough. Patient has a one-week history of cough associated with right ear pain. She notes sinus discharge and posterior nasal drainage. She's tried Mucinex and DayQuil which have helped a bit. She denies any difficulty breathing or wheezing. Cough is quite bothersome. She's tried multiple over-the-counter medicines which have not helped much. She notes her creatinine has been stable according to her nephrologist.   Past Medical History  Diagnosis Date  . Obesity   . Pregnancy induced hypertension   . Preterm labor   . Gestational diabetes mellitus in childbirth, diet controlled    Past Surgical History  Procedure Laterality Date  . No past surgeries     Social History  Substance Use Topics  . Smoking status: Former Smoker -- 0.25 packs/day for 4 years    Types: Cigarettes    Quit date: 07/14/2011  . Smokeless tobacco: Never Used  . Alcohol Use: No     Comment: prior to pregnancy   family history includes Alcohol abuse in her paternal grandfather; Asthma in her brother; Cancer in her maternal grandmother; Diabetes in her father; Hypertension in her brother, father, maternal grandfather, maternal grandmother, paternal grandfather, and paternal grandmother; Kidney disease in her brother. There is no history of Anesthesia problems.  ROS as above Medications: Current Outpatient Prescriptions  Medication Sig Dispense Refill  . docusate sodium (COLACE) 100 MG capsule Take 1 capsule (100 mg total) by mouth 3 (three) times daily as needed. 90 capsule 3  . hydrocortisone (ANUSOL-HC) 25 MG suppository Place 1 suppository (25 mg total) rectally 2 (two) times daily as needed for hemorrhoids or itching. 24 suppository 2  . telmisartan (MICARDIS) 20 MG tablet Take 20 mg by mouth daily.    . Vitamin D, Ergocalciferol, (DRISDOL) 50000 UNITS CAPS capsule Take 50,000 Units by mouth every 7 (seven)  days.    Marland Kitchen witch hazel-glycerin (TUCKS) pad Apply 1 application topically as needed. 1 each 11  . azithromycin (ZITHROMAX) 250 MG tablet Take 1 tablet (250 mg total) by mouth daily. Take first 2 tablets together, then 1 every day until finished. 6 tablet 0  . ipratropium (ATROVENT) 0.06 % nasal spray Place 2 sprays into both nostrils 4 (four) times daily. 15 mL 1  . predniSONE (DELTASONE) 10 MG tablet Take 3 tablets (30 mg total) by mouth daily. 15 tablet 0  . promethazine-codeine (PHENERGAN WITH CODEINE) 6.25-10 MG/5ML syrup Take 5 mLs by mouth every 6 (six) hours as needed for cough. 120 mL 0   No current facility-administered medications for this visit.   No Known Allergies   Exam:  BP 94/59 mmHg  Pulse 70  Temp(Src) 98.7 F (37.1 C) (Oral)  Wt 219 lb (99.338 kg) Gen: Well NAD HEENT: EOMI,  MMM clear nasal discharge. Posterior pharynx with cobblestoning. Normal tympanic membranes bilaterally. Lungs: Normal work of breathing. Coarse breath sounds left long Heart: RRR no MRG Abd: NABS, Soft. Nondistended, Nontender Exts: Brisk capillary refill, warm and well perfused.    Chest x-ray pending  No results found for this or any previous visit (from the past 24 hour(s)). No results found.   Please see individual assessment and plan sections.

## 2015-08-06 NOTE — Assessment & Plan Note (Signed)
Bronchitis. Treat with prednisone azithromycin Atrovent nasal spray including cough syrup. X-ray pending.

## 2015-08-29 ENCOUNTER — Ambulatory Visit (INDEPENDENT_AMBULATORY_CARE_PROVIDER_SITE_OTHER): Payer: PRIVATE HEALTH INSURANCE | Admitting: Physician Assistant

## 2015-08-29 ENCOUNTER — Encounter: Payer: Self-pay | Admitting: Physician Assistant

## 2015-08-29 VITALS — BP 121/68 | HR 86 | Temp 98.6°F | Ht 62.0 in | Wt 219.0 lb

## 2015-08-29 DIAGNOSIS — J329 Chronic sinusitis, unspecified: Secondary | ICD-10-CM

## 2015-08-29 DIAGNOSIS — A499 Bacterial infection, unspecified: Secondary | ICD-10-CM

## 2015-08-29 DIAGNOSIS — B9689 Other specified bacterial agents as the cause of diseases classified elsewhere: Secondary | ICD-10-CM

## 2015-08-29 MED ORDER — LEVOFLOXACIN 500 MG PO TABS: 500.0000 mg | ORAL_TABLET | Freq: Every day | ORAL | Status: DC

## 2015-08-29 MED ORDER — HYDROCOD POLST-CPM POLST ER 10-8 MG/5ML PO SUER: 5.0000 mL | Freq: Two times a day (BID) | ORAL | Status: DC | PRN

## 2015-08-29 NOTE — Progress Notes (Signed)
   Subjective:    Patient ID: Laura Robinson, female    DOB: [birth year], 29 y.o.   MRN: [id number]  HPI Pt is a 29 yo female who presents to the clinic with sinus pressure, cough, Headache and right ear pain. She has been sick since thanksgiving. She came into clinic and dx with bronchitis by  Dr. Lynne Leader and given zpak with cough syrup. She has minimal improvement. She then began having more upper respiratory symptoms with headache and sinus pressure. She cannot take ibuprofen due to kidney disease. She has taken some sudafed without relief. No fever, chills, n/v/d, body aches.    Review of Systems  All other systems reviewed and are negative.      Objective:   Physical Exam  Constitutional: She is oriented to person, place, and time. She appears well-developed and well-nourished.  HENT:  Head: Normocephalic and atraumatic.  Right Ear: External ear normal.  Left Ear: External ear normal.  Nose: Nose normal.  TM's dull bilaterally. No blood or pus.   Bilateral maxillary sinuses tender to palpation.   Oropharynx erythematous with no tonsilar exudate or swelling.   Eyes: Conjunctivae are normal. Right eye exhibits no discharge. Left eye exhibits no discharge.  Neck: Normal range of motion. Neck supple.  Tender bilateral anterior cervical lymphnodes.   Cardiovascular: Normal rate, regular rhythm and normal heart sounds.   Pulmonary/Chest: Effort normal and breath sounds normal. She has no wheezes.  Lymphadenopathy:    She has cervical adenopathy.  Neurological: She is alert and oriented to person, place, and time.  Skin: Skin is dry.  Psychiatric: She has a normal mood and affect. Her behavior is normal.          Assessment & Plan:  Bacterial sinusitis- treated with levaquin for 7 days. Discussed flonase and mucinex. Follow up with no improvement.

## 2015-08-29 NOTE — Patient Instructions (Signed)

## 2015-09-25 ENCOUNTER — Encounter: Payer: Self-pay | Admitting: Physician Assistant

## 2015-10-01 ENCOUNTER — Ambulatory Visit (INDEPENDENT_AMBULATORY_CARE_PROVIDER_SITE_OTHER): Payer: PRIVATE HEALTH INSURANCE | Admitting: Physician Assistant

## 2015-10-01 ENCOUNTER — Ambulatory Visit: Payer: PRIVATE HEALTH INSURANCE | Admitting: Physician Assistant

## 2015-10-01 ENCOUNTER — Encounter: Payer: Self-pay | Admitting: Physician Assistant

## 2015-10-01 VITALS — BP 118/69 | HR 69 | Ht 62.0 in | Wt 220.0 lb

## 2015-10-01 DIAGNOSIS — R300 Dysuria: Secondary | ICD-10-CM | POA: Diagnosis not present

## 2015-10-01 DIAGNOSIS — M797 Fibromyalgia: Secondary | ICD-10-CM

## 2015-10-01 DIAGNOSIS — E669 Obesity, unspecified: Secondary | ICD-10-CM

## 2015-10-01 DIAGNOSIS — B351 Tinea unguium: Secondary | ICD-10-CM

## 2015-10-01 DIAGNOSIS — R8290 Unspecified abnormal findings in urine: Secondary | ICD-10-CM

## 2015-10-01 DIAGNOSIS — B372 Candidiasis of skin and nail: Secondary | ICD-10-CM

## 2015-10-01 DIAGNOSIS — N390 Urinary tract infection, site not specified: Secondary | ICD-10-CM

## 2015-10-01 DIAGNOSIS — N3000 Acute cystitis without hematuria: Secondary | ICD-10-CM | POA: Diagnosis not present

## 2015-10-01 DIAGNOSIS — R82998 Other abnormal findings in urine: Secondary | ICD-10-CM

## 2015-10-01 DIAGNOSIS — E559 Vitamin D deficiency, unspecified: Secondary | ICD-10-CM

## 2015-10-01 LAB — POCT URINALYSIS DIPSTICK
BILIRUBIN UA: NEGATIVE
GLUCOSE UA: NEGATIVE
Ketones, UA: NEGATIVE
NITRITE UA: NEGATIVE
Protein, UA: 100
RBC UA: NEGATIVE
Spec Grav, UA: 1.02
Urobilinogen, UA: 0.2
pH, UA: 5.5

## 2015-10-01 MED ORDER — KETOCONAZOLE 2 % EX CREA: 1.0000 "application " | TOPICAL_CREAM | Freq: Two times a day (BID) | CUTANEOUS | Status: DC

## 2015-10-01 MED ORDER — CIPROFLOXACIN HCL 500 MG PO TABS: 500.0000 mg | ORAL_TABLET | Freq: Two times a day (BID) | ORAL | Status: DC

## 2015-10-01 MED ORDER — CICLOPIROX 8 % EX SOLN: Freq: Every day | CUTANEOUS | Status: DC

## 2015-10-01 NOTE — Patient Instructions (Signed)
Stop 50,000 units and start vitamin D3 2,000 daily. Recheck in 2 months.

## 2015-10-01 NOTE — Progress Notes (Signed)
   Subjective:    Patient ID: Laura Robinson, female    DOB: Aug 26, 1986, 30 y.o.   MRN: [id number]  HPI  Patient is a 30 year old female who presents to the clinic with lower abdominal pressure, dysuria, abnormal urine odor for the last week. She denies any flank pain. She denies any fever, chills, body aches, nausea, vomiting or diarrhea. She has not tried anything to make better. Denies any vaginal discharge or itching.   Both of her big toes are thick and discolored. She wonders if there is anything she could do for them. She has noticed this for quite a while. She has not tried anything to make better.  She has a history of low vitamin D. She was once given high-dose vitamin D she wonders if she can have this again. It was confirmed by rheumatologist that she also has fibromyalgia. They also suggested to get her vitamin D levels up.  Patient is obese and has a rash in her bilateral groin. She has had this rash before and she was given a cream that helped resolve it. She would like to have that record cream again    Review of Systems    see HPI.  Objective:   Physical Exam  Constitutional: She appears well-developed and well-nourished.  Obese.   HENT:  Head: Normocephalic and atraumatic.  Cardiovascular: Normal rate, regular rhythm and normal heart sounds.   Pulmonary/Chest: Effort normal and breath sounds normal. She has no wheezes.  No CVA tenderness.   Abdominal: Soft. Bowel sounds are normal. She exhibits no distension and no mass. There is no tenderness. There is no rebound and no guarding.  Skin:  Bilateral great toenail thick and discolred.           Assessment & Plan:  Acute cystitis without hematuria-.. Results for orders placed or performed in visit on 10/01/15  Urine Culture  Result Value Ref Range   Colony Count NO GROWTH    Organism ID, Bacteria NO GROWTH   POCT urinalysis dipstick  Result Value Ref Range   Color, UA yellow    Clarity, UA cloudy    Glucose,  UA neg    Bilirubin, UA neg    Ketones, UA neg    Spec Grav, UA 1.020    Blood, UA neg    pH, UA 5.5    Protein, UA 100    Urobilinogen, UA 0.2    Nitrite, UA neg    Leukocytes, UA moderate (2+) (A) Negative   Given cipro pending culture. If negative culture will stop cipro. HO given for symptomatic care.   Onychomycosis- given penlac to try for next 12 weeks. Discussed how fungus takes time to resolve.   Vitamin D- given 50,000 units for 2 months then needs to transiition to OTC 2000 units daily.   Obese/candidasis intertrigo- discussed weight loss. Patient would need to come back for an appointment to discuss this in detail. Treated yeast rash with ketoconazole cream. Follow-up as needed.  Fibromyaglia/obese- would like for patient to come back to office to discuss ways to improve fibromyalgia pain. The first would be exercise and weight loss. Also getting her vitamin D levels up can also help with fibromyalgia pain.

## 2015-10-03 ENCOUNTER — Telehealth: Payer: Self-pay | Admitting: Emergency Medicine

## 2015-10-03 DIAGNOSIS — B372 Candidiasis of skin and nail: Secondary | ICD-10-CM | POA: Insufficient documentation

## 2015-10-03 DIAGNOSIS — E669 Obesity, unspecified: Secondary | ICD-10-CM | POA: Insufficient documentation

## 2015-10-03 LAB — URINE CULTURE
COLONY COUNT: NO GROWTH
ORGANISM ID, BACTERIA: NO GROWTH

## 2015-10-03 NOTE — Telephone Encounter (Signed)
Spoke with patient and told her no growth on urine culture. Can stop cipro. Symptoms you were experiencing were not related to bacterial infection. She now reports continued burning upon urination and itching in perineal area. pak

## 2015-10-24 ENCOUNTER — Ambulatory Visit: Payer: PRIVATE HEALTH INSURANCE

## 2015-11-22 ENCOUNTER — Telehealth: Payer: Self-pay

## 2015-11-22 ENCOUNTER — Ambulatory Visit (INDEPENDENT_AMBULATORY_CARE_PROVIDER_SITE_OTHER): Payer: PRIVATE HEALTH INSURANCE | Admitting: Family Medicine

## 2015-11-22 ENCOUNTER — Ambulatory Visit (INDEPENDENT_AMBULATORY_CARE_PROVIDER_SITE_OTHER): Payer: PRIVATE HEALTH INSURANCE

## 2015-11-22 ENCOUNTER — Encounter: Payer: Self-pay | Admitting: Family Medicine

## 2015-11-22 VITALS — BP 114/77 | HR 62 | Wt 212.0 lb

## 2015-11-22 DIAGNOSIS — M79672 Pain in left foot: Secondary | ICD-10-CM

## 2015-11-22 MED ORDER — TRAMADOL HCL 50 MG PO TABS: 50.0000 mg | ORAL_TABLET | Freq: Three times a day (TID) | ORAL | Status: DC | PRN

## 2015-11-22 NOTE — Patient Instructions (Signed)
Thank you for coming in today. Return in 2-3 weeks.   Use the shoe and crutches as needed.

## 2015-11-22 NOTE — Progress Notes (Signed)
   Subjective:    I'm seeing this patient as a consultation for:  Iran Planas PA  CC: Foot pain  HPI: Patient is acute onset of left foot pain this morning. She woke about 3 AM and went to the bathroom and felt sudden onset of sharp mid foot left sidded pain. He denies any specific injury or inversion. She notes she had been doing Zumba of the day before but denies any specific injury during [place]. She has not tried any medicines for pain. She notes activity worsens her pain. Rest improves her pain.  Past medical history, Surgical history, Family history not pertinant except as noted below, Social history, Allergies, and medications have been entered into the medical record, reviewed, and no changes needed.   Review of Systems: No headache, visual changes, nausea, vomiting, diarrhea, constipation, dizziness, abdominal pain, skin rash, fevers, chills, night sweats, weight loss, swollen lymph nodes, body aches, joint swelling, muscle aches, chest pain, shortness of breath, mood changes, visual or auditory hallucinations.   Objective:    Filed Vitals:   11/22/15 1145  BP: 114/77  Pulse: 62   General: Well Developed, well nourished, and in no acute distress.  Neuro/Psych: Alert and oriented x3, extra-ocular muscles intact, able to move all 4 extremities, sensation grossly intact. Skin: Warm and dry, no rashes noted.  Respiratory: Not using accessory muscles, speaking in full sentences, trachea midline.  Cardiovascular: Pulses palpable, no extremity edema. Abdomen: Does not appear distended. MSK: Foot and ankle are normal-appearing without any obvious swelling or ecchymosis. Tender palpation fourth metatarsal. Motion intact. Pulses capillary refill sensation intact. Antalgic gait.  Left foot x-ray no obvious fractures. Awaiting formal radiology review  No results found for this or any previous visit (from the past 24 hour(s)). No results found.  Impression and Recommendations:   This  case required medical decision making of moderate complexity.

## 2015-11-22 NOTE — Telephone Encounter (Signed)
See note below

## 2015-11-22 NOTE — Addendum Note (Signed)
Addended by: Gregor Hams on: 11/22/2015 04:51 PM   Modules accepted: Orders

## 2015-11-22 NOTE — Telephone Encounter (Signed)
Rx prescribed. Faxed.

## 2015-11-22 NOTE — Assessment & Plan Note (Signed)
Likely metatarsal stress reaction versus stress fracture.  Plan for postoperative shoe and crutches as needed. Use Tylenol for pain control. Return in a few weeks.

## 2015-11-22 NOTE — Telephone Encounter (Signed)
Patient states she has decided she will need the Tramadol prescription.

## 2015-11-23 ENCOUNTER — Ambulatory Visit: Payer: PRIVATE HEALTH INSURANCE | Admitting: Physician Assistant

## 2015-11-23 NOTE — Progress Notes (Signed)
Quick Note:  Normal, no changes. ______ 

## 2015-12-13 ENCOUNTER — Ambulatory Visit (INDEPENDENT_AMBULATORY_CARE_PROVIDER_SITE_OTHER): Payer: PRIVATE HEALTH INSURANCE | Admitting: Osteopathic Medicine

## 2015-12-13 ENCOUNTER — Encounter: Payer: Self-pay | Admitting: Osteopathic Medicine

## 2015-12-13 ENCOUNTER — Ambulatory Visit: Payer: PRIVATE HEALTH INSURANCE | Admitting: Osteopathic Medicine

## 2015-12-13 VITALS — BP 97/65 | HR 62 | Temp 97.6°F | Wt 202.0 lb

## 2015-12-13 DIAGNOSIS — R1013 Epigastric pain: Secondary | ICD-10-CM

## 2015-12-13 LAB — CBC WITH DIFFERENTIAL/PLATELET
BASOS PCT: 1 %
Basophils Absolute: 72 cells/uL (ref 0–200)
EOS PCT: 2 %
Eosinophils Absolute: 144 cells/uL (ref 15–500)
HCT: 41 % (ref 35.0–45.0)
Hemoglobin: 13.4 g/dL (ref 11.7–15.5)
LYMPHS PCT: 29 %
Lymphs Abs: 2088 cells/uL (ref 850–3900)
MCH: 28.4 pg (ref 27.0–33.0)
MCHC: 32.7 g/dL (ref 32.0–36.0)
MCV: 86.9 fL (ref 80.0–100.0)
MONOS PCT: 5 %
MPV: 9.6 fL (ref 7.5–12.5)
Monocytes Absolute: 360 cells/uL (ref 200–950)
NEUTROS PCT: 63 %
Neutro Abs: 4536 cells/uL (ref 1500–7800)
PLATELETS: 317 10*3/uL (ref 140–400)
RBC: 4.72 MIL/uL (ref 3.80–5.10)
RDW: 13.6 % (ref 11.0–15.0)
WBC: 7.2 10*3/uL (ref 3.8–10.8)

## 2015-12-13 LAB — COMPLETE METABOLIC PANEL WITH GFR
ALT: 34 U/L — AB (ref 6–29)
AST: 26 U/L (ref 10–30)
Albumin: 4.4 g/dL (ref 3.6–5.1)
Alkaline Phosphatase: 81 U/L (ref 33–115)
BUN: 11 mg/dL (ref 7–25)
CHLORIDE: 105 mmol/L (ref 98–110)
CO2: 24 mmol/L (ref 20–31)
CREATININE: 1.33 mg/dL — AB (ref 0.50–1.10)
Calcium: 9.8 mg/dL (ref 8.6–10.2)
GFR, Est African American: 63 mL/min (ref >=60)
GFR, Est Non African American: 54 mL/min — ABNORMAL LOW (ref >=60)
Glucose, Bld: 88 mg/dL (ref 65–99)
POTASSIUM: 4.5 mmol/L (ref 3.5–5.3)
Sodium: 140 mmol/L (ref 135–146)
Total Bilirubin: 0.5 mg/dL (ref 0.2–1.2)
Total Protein: 7.5 g/dL (ref 6.1–8.1)

## 2015-12-13 LAB — LIPASE: LIPASE: 31 U/L (ref 7–60)

## 2015-12-13 LAB — TSH: TSH: 1.91 m[IU]/L

## 2015-12-13 LAB — AMYLASE: Amylase: 46 U/L (ref 0–105)

## 2015-12-13 MED ORDER — DICYCLOMINE HCL 10 MG PO CAPS: 10.0000 mg | ORAL_CAPSULE | Freq: Three times a day (TID) | ORAL | Status: DC | PRN

## 2015-12-13 MED ORDER — PANTOPRAZOLE SODIUM 40 MG PO TBEC: 40.0000 mg | DELAYED_RELEASE_TABLET | Freq: Every day | ORAL | Status: DC

## 2015-12-13 NOTE — Patient Instructions (Signed)
Esophageal Spasm An esophageal spasm is a muscle spasm of the tube that connects the back of your throat to your stomach (esophagus). Your esophagus normally moves food and liquids down into your stomach with smooth, wavelike muscle contractions. If you have esophageal spasms, abnormal muscle contractions in your esophagus can be painful and cause you to have difficulty swallowing (dysphagia). There are two types of esophageal spasms. You may have one or both types:  Diffuse esophageal spasms. These are irregular, uncoordinated muscle movements of the esophagus. The diffuse type causes more dysphagia.  Nutcracker esophagus. This is a type of muscle contraction that is coordinated but too strong. The muscles move in a regular order, but the contraction is stronger than necessary. The nutcracker type of esophageal spasm is more painful. Severe cases of esophageal spasm can make it hard to eat well and do all your usual activities. Esophageal spasms often occur along with severe heartburn (reflux esophagitis). Swallowed foods or liquids may also come back up into your throat (regurgitation).  CAUSES  The cause of esophageal spasm is not known. RISK FACTORS You may have a higher risk for esophageal spasm if you:  Are female.  Are an older person.  Eat very quickly.  Swallow foods or drinks that are very hot or very cold.  Are depressed or anxious. SIGNS AND SYMPTOMS  Signs and symptoms can vary from day to day. They may be mild or severe. They may last for minutes or hours. Common signs and symptoms include:  Chest pain. This may feel like a heart attack.  Back pain.  Dysphagia.  Heartburn.  The feeling that something is stuck in your throat (globus).  Regurgitation of foods or liquids. DIAGNOSIS  Your health care provider may suspect esophageal spasm based on your symptoms and a physical exam. Tests may be done to help confirm the diagnosis. These may include:  Endoscopy. A  flexible telescope is passed into your esophagus.  Barium swallow. An X-ray is done while you drink a substance (contrast material) that shows up on X-ray.  Esophageal manometry. Pressure measurements of the inside of your esophagus are taken while you swallow. TREATMENT  Mild cases of esophageal spasms may not need to be treated. You may be able to manage the spasms by avoiding the foods and eating habits that trigger them. Treatment for spasms that are more severe or frequent can include the following:  You may be given medicine to:  Relax the esophageal muscles.  Relieve muscle spasms (calcium channel blockers and nitrates).  Block nerve endings in the esophagus. This is done with an injection of a certain toxin (botulinum).  Relieve heartburn (proton pump inhibitors).  Antidepressant medicines are sometimes used to ease symptoms.  For severe cases, surgery is sometimes done to reduce esophageal muscle contractions (myotomy). HOME CARE INSTRUCTIONS   Take medicines only as directed by your health care provider.  Do not eat foods that trigger heartburn or esophageal spasms.  Eat your meals slowly.  Chew your food completely.  Avoid foods and drinks that are very hot or very cold.  Find ways to manage stress.  Ask for help if you struggle with depression or anxiety.  Keep all follow-up visits as directed by your health care provider. This is important. SEEK MEDICAL CARE IF:   Your symptoms change or get worse.  You are losing weight because of dysphagia.  Your medicine is not helping your symptoms.  Your esophageal spasms are interfering with your quality of life. SEEK  IMMEDIATE MEDICAL CARE IF:   You have very bad or unusual chest pain.  You have trouble breathing.  You choke. MAKE SURE YOU:  Understand these instructions.  Will watch your condition.  Will get help right away if you are not doing well or get worse.   This information is not intended to  replace advice given to you by your health care provider. Make sure you discuss any questions you have with your health care provider.   Document Released: 11/08/2002 Document Revised: 09/08/2014 Document Reviewed: 11/11/2013 Elsevier Interactive Patient Education 2016 [employer].   Abdominal Pain, Adult Many things can cause abdominal pain. Usually, abdominal pain is not caused by a disease and will improve without treatment. It can often be observed and treated at home. Your health care provider will do a physical exam and possibly order blood tests and X-rays to help determine the seriousness of your pain. However, in many cases, more time must pass before a clear cause of the pain can be found. Before that point, your health care provider may not know if you need more testing or further treatment. HOME CARE INSTRUCTIONS Monitor your abdominal pain for any changes. The following actions may help to alleviate any discomfort you are experiencing:  Only take over-the-counter or prescription medicines as directed by your health care provider.  Do not take laxatives unless directed to do so by your health care provider.  Try a clear liquid diet (broth, tea, or water) as directed by your health care provider. Slowly move to a bland diet as tolerated. SEEK MEDICAL CARE IF:  You have unexplained abdominal pain.  You have abdominal pain associated with nausea or diarrhea.  You have pain when you urinate or have a bowel movement.  You experience abdominal pain that wakes you in the night.  You have abdominal pain that is worsened or improved by eating food.  You have abdominal pain that is worsened with eating fatty foods.  You have a fever. SEEK IMMEDIATE MEDICAL CARE IF:  Your pain does not go away within 2 hours.  You keep throwing up (vomiting).  Your pain is felt only in portions of the abdomen, such as the right side or the left lower portion of the abdomen.  You pass bloody  or black tarry stools. MAKE SURE YOU:  Understand these instructions.  Will watch your condition.  Will get help right away if you are not doing well or get worse.   This information is not intended to replace advice given to you by your health care provider. Make sure you discuss any questions you have with your health care provider.   Document Released: 05/28/2005 Document Revised: 05/09/2015 Document Reviewed: 04/27/2013 Elsevier Interactive Patient Education [employer].

## 2015-12-13 NOTE — Progress Notes (Signed)
HPI: Laura Robinson is a 30 y.o. female who presents to [facility] today for chief complaint of:  Chief Complaint  Patient presents with  . Abdominal Pain    x 2 weeks    ABDOMINAL PAIN . Location: epigastric, midline  . Quality: soreness, deep pain  . Duration: few weeks ago  . Timing: felt worst in evening before dinner 2 weeks ago, hung around a few days, then went away, now is off an on. Doesn't seem related to eating.  . Assoc signs/symptoms: nausea initially, now just the pain. No heartburn, no pain with swallowing but feels like sometimes getting "stuck" down low in stomach    Past medical, social and family history reviewed: Past Medical History  Diagnosis Date  . Obesity   . Pregnancy induced hypertension   . Preterm labor   . Gestational diabetes mellitus in childbirth, diet controlled    Past Surgical History  Procedure Laterality Date  . No past surgeries     Social History  Substance Use Topics  . Smoking status: Former Smoker -- 0.25 packs/day for 4 years    Types: Cigarettes    Quit date: 07/14/2011  . Smokeless tobacco: Never Used  . Alcohol Use: No     Comment: prior to pregnancy   Family History  Problem Relation Age of Onset  . Asthma Brother   . Hypertension Brother   . Kidney disease Brother   . Cancer Maternal Grandmother   . Hypertension Maternal Grandmother   . Anesthesia problems Neg Hx   . Diabetes Father   . Hypertension Father   . Hypertension Maternal Grandfather   . Hypertension Paternal Grandmother   . Alcohol abuse Paternal Grandfather   . Hypertension Paternal Grandfather     Current Outpatient Prescriptions  Medication Sig Dispense Refill  . ciclopirox (PENLAC) 8 % solution Apply topically at bedtime. Apply over nail and surrounding skin. Apply daily over previous coat. After seven (7) days, may remove with alcohol and continue cycle. 6.6 mL 2  . docusate sodium (COLACE) 100 MG capsule Take 1  capsule (100 mg total) by mouth 3 (three) times daily as needed. 90 capsule 3  . hydrocortisone (ANUSOL-HC) 25 MG suppository Place 1 suppository (25 mg total) rectally 2 (two) times daily as needed for hemorrhoids or itching. 24 suppository 2  . ipratropium (ATROVENT) 0.06 % nasal spray Place 2 sprays into both nostrils 4 (four) times daily. 15 mL 1  . ketoconazole (NIZORAL) 2 % cream Apply 1 application topically 2 (two) times daily. To affected areas. 60 g 3  . telmisartan (MICARDIS) 20 MG tablet Take 20 mg by mouth daily.    . traMADol (ULTRAM) 50 MG tablet Take 1 tablet (50 mg total) by mouth every 8 (eight) hours as needed. 15 tablet 0  . Vitamin D, Ergocalciferol, (DRISDOL) 50000 UNITS CAPS capsule Take 50,000 Units by mouth every 7 (seven) days.    Marland Kitchen witch hazel-glycerin (TUCKS) pad Apply 1 application topically as needed. 1 each 11   No current facility-administered medications for this visit.   Allergies  Allergen Reactions  . Ibuprofen     Not to take due to kidney disease.       Review of Systems: CONSTITUTIONAL:  No  fever, no chills HEAD/EYES/EARS/NOSE/THROAT: No  headache, no vision change, CARDIAC: No  chest pain, No  pressure, No palpitations RESPIRATORY: No  cough, No  shortness of breath/wheeze GASTROINTESTINAL: No  nausea, No  vomiting, (+) abdominal  pain, No  blood in stool except occasional e/ hemorrhoids, No  diarrhea, No  constipation  MUSCULOSKELETAL: No  myalgia/arthralgia GENITOURINARY: No  incontinence, No  abnormal genital bleeding/discharge SKIN: No  rash/wounds/concerning lesions  Exam:  BP 97/65 mmHg  Pulse 62  Temp(Src) 97.6 F (36.4 C) (Oral)  Wt 202 lb (91.627 kg) Constitutional: VS see above. General Appearance: alert, well-developed, well-nourished, NAD Eyes: Normal lids and conjunctive, non-icteric sclera,  Ears, Nose, Mouth, Throat: MMM, Normal external inspection ears/nares/mouth/lips/gums,  Neck: No masses, trachea midline. Mild thyroid  enlargement, no tenderness/mass appreciated. No lymphadenopathy Respiratory: Normal respiratory effort. no wheeze, no rhonchi, no rales Cardiovascular: S1/S2 normal, no murmur, no rub/gallop auscultated. RRR. No lower extremity edema. Gastrointestinal: Nontender, no masses. No hepatomegaly, no splenomegaly. No hernia appreciated. Bowel sounds normal. Rectal exam deferred.  Skin: warm, dry, intact. No rash/ulcer. No concerning nevi or subq nodules on limited exam.     No results found for this or any previous visit (from the past 72 hour(s)).    ASSESSMENT/PLAN: Labs, imaging, medications as below. Follow-up with Luvenia Starch if symptoms fail to improve, get worse, or change in any way. Would consider GI referral for scope or manometry, differential diagnosis most suspicious for something like esophageal spasm however could be hiatal hernia or other.   Abdominal pain, epigastric - Plan: CBC with Differential/Platelet, COMPLETE METABOLIC PANEL WITH GFR, TSH, Lipase, Amylase, Urinalysis, Pregnancy, urine, US Abdomen Complete, DG Esophagus, pantoprazole (PROTONIX) 40 MG tablet, dicyclomine (BENTYL) 10 MG capsule   Return to see Luvenia Starch (or other if emergency) if symptoms worsen or fail to improve, and as directed by [facility]

## 2015-12-14 LAB — URINALYSIS
Bilirubin Urine: NEGATIVE
GLUCOSE, UA: NEGATIVE
HGB URINE DIPSTICK: NEGATIVE
Ketones, ur: NEGATIVE
NITRITE: NEGATIVE
Specific Gravity, Urine: 1.008 (ref 1.001–1.035)
pH: 6 (ref 5.0–8.0)

## 2015-12-14 LAB — PREGNANCY, URINE: PREG TEST UR: NEGATIVE

## 2015-12-25 ENCOUNTER — Other Ambulatory Visit: Payer: PRIVATE HEALTH INSURANCE

## 2016-01-01 ENCOUNTER — Ambulatory Visit (INDEPENDENT_AMBULATORY_CARE_PROVIDER_SITE_OTHER): Payer: PRIVATE HEALTH INSURANCE | Admitting: Physician Assistant

## 2016-01-01 ENCOUNTER — Encounter: Payer: Self-pay | Admitting: Physician Assistant

## 2016-01-01 ENCOUNTER — Telehealth: Payer: Self-pay | Admitting: *Deleted

## 2016-01-01 VITALS — BP 112/54 | HR 70 | Ht 62.0 in | Wt 197.0 lb

## 2016-01-01 DIAGNOSIS — N3000 Acute cystitis without hematuria: Secondary | ICD-10-CM

## 2016-01-01 DIAGNOSIS — N926 Irregular menstruation, unspecified: Secondary | ICD-10-CM

## 2016-01-01 DIAGNOSIS — N390 Urinary tract infection, site not specified: Secondary | ICD-10-CM

## 2016-01-01 DIAGNOSIS — L682 Localized hypertrichosis: Secondary | ICD-10-CM

## 2016-01-01 DIAGNOSIS — R300 Dysuria: Secondary | ICD-10-CM | POA: Diagnosis not present

## 2016-01-01 DIAGNOSIS — E669 Obesity, unspecified: Secondary | ICD-10-CM

## 2016-01-01 DIAGNOSIS — L678 Other hair color and hair shaft abnormalities: Secondary | ICD-10-CM

## 2016-01-01 DIAGNOSIS — R82998 Other abnormal findings in urine: Secondary | ICD-10-CM

## 2016-01-01 LAB — POCT URINALYSIS DIPSTICK
Bilirubin, UA: NEGATIVE
Glucose, UA: NEGATIVE
Ketones, UA: NEGATIVE
Nitrite, UA: NEGATIVE
PH UA: 5.5
PROTEIN UA: 30
RBC UA: NEGATIVE
UROBILINOGEN UA: 0.2

## 2016-01-01 MED ORDER — NITROFURANTOIN MONOHYD MACRO 100 MG PO CAPS: 100.0000 mg | ORAL_CAPSULE | Freq: Two times a day (BID) | ORAL | Status: DC

## 2016-01-01 NOTE — Progress Notes (Addendum)
   Subjective:    Patient ID: Laura Robinson, female    DOB: 1985/12/20, 30 y.o.   MRN: [id number]  HPI Patient is here today complaining of burning after urination x 5-6 days on and off. Denies urinary frequency or urgency. She reports vaginal discharge with some odor. Denies vaginal itching. She states she has had some suprapubic pain but denies fever, chills, and back pain.  Patient also reports some irregular and missed periods.  Review of Systems  Genitourinary: Positive for dysuria and vaginal discharge. Negative for urgency, frequency, hematuria, flank pain and difficulty urinating.       Objective:   Physical Exam  Constitutional: She is oriented to person, place, and time. She appears well-developed and well-nourished.  Obese   HENT:  Head: Normocephalic and atraumatic.    Eyes: Right eye exhibits no discharge. Left eye exhibits no discharge.  Cardiovascular: Normal rate, regular rhythm and normal heart sounds.   Pulmonary/Chest: Effort normal and breath sounds normal.  Abdominal: Soft. Bowel sounds are normal. She exhibits no distension. There is tenderness (Patient reports minimal suprapubic tenderness with palpation). There is no rebound and no guarding.  Neurological: She is alert and oriented to person, place, and time.  Skin: Skin is warm and dry.  Psychiatric: She has a normal mood and affect. Her behavior is normal. Judgment normal.          Assessment & Plan:  1. Urinary Tract Infection- Patient presents with complaints of burning with urination and after urination off and on for the past 6 days. UA with 30 mg/dL protein, trace leuks, -nitrates, negative blood. Will treat with Macrobid. Will culture to confirm bacterial infection.   2. Irregular periods/facial hair growth/obesity- Given this triad of symptoms, PCOS is likely. Patient states she believes she has had testing done with her OBGYN to determine if she has PCOS and does not want to pursue further  testing at this time.

## 2016-01-01 NOTE — Patient Instructions (Signed)
Polycystic Ovarian Syndrome  Polycystic ovarian syndrome (PCOS) is a common hormonal disorder among women of reproductive age. Most women with PCOS grow many small cysts on their ovaries. PCOS can cause problems with your periods and make it difficult to get pregnant. It can also cause an increased risk of miscarriage with pregnancy. If left untreated, PCOS can lead to serious health problems, such as diabetes and heart disease.  CAUSES  The cause of PCOS is not fully understood, but genetics may be a factor.  SIGNS AND SYMPTOMS   · Infrequent or no menstrual periods.    · Inability to get pregnant (infertility) because of not ovulating.    · Increased growth of hair on the face, chest, stomach, back, thumbs, thighs, or toes.    · Acne, oily skin, or dandruff.    · Pelvic pain.    · Weight gain or obesity, usually carrying extra weight around the waist.    · Type 2 diabetes.     · High cholesterol.    · High blood pressure.    · Female-pattern baldness or thinning hair.    · Patches of thickened and dark brown or black skin on the neck, arms, breasts, or thighs.    · Tiny excess flaps of skin (skin tags) in the armpits or neck area.    · Excessive snoring and having breathing stop at times while asleep (sleep apnea).    · Deepening of the voice.    · Gestational diabetes when pregnant.    DIAGNOSIS   There is no single test to diagnose PCOS.   · Your health care provider will:      Take a medical history.      Perform a pelvic exam.      Have ultrasonography done.      Check your female and female hormone levels.      Measure glucose or sugar levels in the blood.      Do other blood tests.    · If you are producing too many female hormones, your health care provider will make sure it is from PCOS. At the physical exam, your health care provider will want to evaluate the areas of increased hair growth. Try to allow natural hair growth for a few days before the visit.    · During a pelvic exam, the ovaries may be enlarged  or swollen because of the increased number of small cysts. This can be seen more easily by using vaginal ultrasonography or screening to examine the ovaries and lining of the uterus (endometrium) for cysts. The uterine lining may become thicker if you have not been having a regular period.    TREATMENT   Because there is no cure for PCOS, it needs to be managed to prevent problems. Treatments are based on your symptoms. Treatment is also based on whether you want to have a baby or whether you need contraception.   Treatment may include:   · Progesterone hormone to start a menstrual period.    · Birth control pills to make you have regular menstrual periods.    · Medicines to make you ovulate, if you want to get pregnant.    · Medicines to control your insulin.    · Medicine to control your blood pressure.    · Medicine and diet to control your high cholesterol and triglycerides in your blood.  · Medicine to reduce excessive hair growth.   · Surgery, making small holes in the ovary, to decrease the amount of female hormone production. This is done through a long, lighted tube (laparoscope) placed into the pelvis through a tiny incision in the lower abdomen.      HOME CARE INSTRUCTIONS  · Only take over-the-counter or prescription medicine as directed by your health care provider.  · Pay attention to the foods you eat and your activity levels. This can help reduce the effects of PCOS.    Keep your weight under control.    Eat foods that are low in carbohydrate and high in fiber.    Exercise regularly.  SEEK MEDICAL CARE IF:  · Your symptoms do not get better with medicine.  · You have new symptoms.     This information is not intended to replace advice given to you by your health care provider. Make sure you discuss any questions you have with your health care provider.     Document Released: 12/12/2004 Document Revised: 06/08/2013 Document Reviewed: 02/03/2013  Elsevier Interactive Patient Education ©2016 [employer]

## 2016-01-01 NOTE — Telephone Encounter (Signed)
Pt left vm stating that she talked to her GYN and they have not tested her for PCOS.

## 2016-01-02 NOTE — Telephone Encounter (Signed)
If she would like to be screened can order testosterone total/free DHEAS Prolactin TSH Make sure has lipid

## 2016-01-03 LAB — URINE CULTURE: Colony Count: 7000

## 2016-01-04 NOTE — Telephone Encounter (Signed)
Labs ordered. Pt notified.

## 2016-04-11 ENCOUNTER — Encounter: Payer: Self-pay | Admitting: Osteopathic Medicine

## 2016-04-11 ENCOUNTER — Other Ambulatory Visit (HOSPITAL_COMMUNITY)
Admission: RE | Admit: 2016-04-11 | Discharge: 2016-04-11 | Disposition: A | Discharge status: Home / Self Care | Payer: PRIVATE HEALTH INSURANCE | Source: Ambulatory Visit | Attending: Osteopathic Medicine | Admitting: Osteopathic Medicine

## 2016-04-11 ENCOUNTER — Ambulatory Visit (INDEPENDENT_AMBULATORY_CARE_PROVIDER_SITE_OTHER): Payer: PRIVATE HEALTH INSURANCE | Admitting: Osteopathic Medicine

## 2016-04-11 VITALS — BP 107/72 | HR 89 | Ht 62.0 in | Wt 204.0 lb

## 2016-04-11 DIAGNOSIS — Z113 Encounter for screening for infections with a predominantly sexual mode of transmission: Secondary | ICD-10-CM | POA: Insufficient documentation

## 2016-04-11 DIAGNOSIS — N760 Acute vaginitis: Secondary | ICD-10-CM | POA: Diagnosis not present

## 2016-04-11 DIAGNOSIS — B373 Candidiasis of vulva and vagina: Secondary | ICD-10-CM | POA: Diagnosis not present

## 2016-04-11 DIAGNOSIS — R300 Dysuria: Secondary | ICD-10-CM

## 2016-04-11 DIAGNOSIS — Z01419 Encounter for gynecological examination (general) (routine) without abnormal findings: Secondary | ICD-10-CM | POA: Insufficient documentation

## 2016-04-11 DIAGNOSIS — N926 Irregular menstruation, unspecified: Secondary | ICD-10-CM

## 2016-04-11 DIAGNOSIS — R102 Pelvic and perineal pain: Secondary | ICD-10-CM | POA: Diagnosis not present

## 2016-04-11 DIAGNOSIS — B3731 Acute candidiasis of vulva and vagina: Secondary | ICD-10-CM

## 2016-04-11 DIAGNOSIS — R809 Proteinuria, unspecified: Secondary | ICD-10-CM

## 2016-04-11 LAB — POCT URINALYSIS DIPSTICK
BILIRUBIN UA: NEGATIVE
Blood, UA: NEGATIVE
Glucose, UA: NEGATIVE
Ketones, UA: NEGATIVE
NITRITE UA: NEGATIVE
SPEC GRAV UA: 1.01
UROBILINOGEN UA: 0.2
pH, UA: 6.5

## 2016-04-11 LAB — WET PREP, GENITAL
Clue Cells Wet Prep HPF POC: NONE SEEN
TRICH WET PREP: NONE SEEN
YEAST WET PREP: NONE SEEN

## 2016-04-11 MED ORDER — FLUCONAZOLE 150 MG PO TABS: 150.0000 mg | ORAL_TABLET | Freq: Once | ORAL | 1 refills | Status: AC

## 2016-04-11 NOTE — Progress Notes (Signed)
HPI: Laura Robinson is a 30 y.o. female  who presents to [facility] today, 04/11/16,  for chief complaint of:  Chief Complaint  Patient presents with  . Abdominal Pain    LOWER AND BURNING WITH URINATION    . Location: tenderness with wiping - at the urethra.  . Quality: significant constant itching in vagina, lower abdominal discomfort, feels like yeast infection except lower abd pain is concerning her so she came in to be seen.  . Duration: few weeks but getting worse since yesterday  . Modifying factors: no OTC Rx tried . Assoc signs/symptoms: no fever/chills, no urinary frequency    Past medical, surgical, social and family history reviewed: Past Medical History:  Diagnosis Date  . Gestational diabetes mellitus in childbirth, diet controlled   . Obesity   . Pregnancy induced hypertension   . Preterm labor    Past Surgical History:  Procedure Laterality Date  . NO PAST SURGERIES     Social History  Substance Use Topics  . Smoking status: Former Smoker    Packs/day: 0.25    Years: 4.00    Types: Cigarettes    Quit date: 07/14/2011  . Smokeless tobacco: Never Used  . Alcohol use No     Comment: prior to pregnancy   Family History  Problem Relation Age of Onset  . Asthma Brother   . Hypertension Brother   . Kidney disease Brother   . Cancer Maternal Grandmother   . Hypertension Maternal Grandmother   . Anesthesia problems Neg Hx   . Diabetes Father   . Hypertension Father   . Hypertension Maternal Grandfather   . Hypertension Paternal Grandmother   . Alcohol abuse Paternal Grandfather   . Hypertension Paternal Grandfather      Current medication list and allergy/intolerance information reviewed:   Current Outpatient Prescriptions  Medication Sig Dispense Refill  . Cholecalciferol (VITAMIN D-3 PO) Take by mouth daily.    Marland Kitchen ipratropium (ATROVENT) 0.06 % nasal spray Place 2 sprays into both nostrils 4 (four) times daily. 15  mL 1  . telmisartan (MICARDIS) 20 MG tablet Take 20 mg by mouth daily.     No current facility-administered medications for this visit.    Allergies  Allergen Reactions  . Ibuprofen     Not to take due to kidney disease.       Review of Systems:  Constitutional:  No  fever, no chills, No recent illness, No unintentional weight changes.   HEENT: No  headache, No sore throat, No  sinus pressure  Cardiac: No  chest pain,   Respiratory:  No  shortness of breath.  Gastrointestinal: (+) lower abdominal pain, No  nausea, No  vomiting,  No  blood in stool, No  diarrhea, No  constipation   Musculoskeletal: No new myalgia/arthralgia  Genitourinary: No  incontinence, No  abnormal genital bleeding, (+) abnormal genital discharge  Skin: No  Rash, No other wounds/concerning lesions  Exam:  BP 107/72   Pulse 89   Ht 5\' 2"  (1.575 m)   Wt 204 lb (92.5 kg)   BMI 37.31 kg/m   Constitutional: VS see above. General Appearance: alert, well-developed, well-nourished, NAD  Ears, Nose, Mouth, Throat: MMM, Normal external inspection ears/nares/mouth/lips/gums.   Neck: No masses, trachea midline.   Respiratory: Normal respiratory effort.   Cardiovascular: S1/S2 normal  Gastrointestinal: Significant central obesity.  (+)TTP on pelvic suprapubic and mild to L adnexal region , no masses. No hepatomegaly, no splenomegaly. No  hernia appreciated. Bowel sounds normal. Rectal exam deferred.   Skin: warm, dry, intact GYN: No lesions/ulcers to external genitalia, normal urethra, normal vaginal mucosa, whitish thick discharge, cervix appears reddish, friable but no significant lesions or cervical motion tenderness, uterus not enlarged, adnexa no masses    Results for orders placed or performed in visit on 04/11/16 (from the past 72 hour(s))  POCT Urinalysis Dipstick     Status: Abnormal   Collection Time: 04/11/16  9:21 AM  Result Value Ref Range   Color, UA YELLOW    Clarity, UA CLEAR     Glucose, UA NEGATIVE    Bilirubin, UA NEGATIVE    Ketones, UA NEGATIVE    Spec Grav, UA 1.010    Blood, UA NEGATIVE    pH, UA 6.5    Protein, UA TRACE    Urobilinogen, UA 0.2    Nitrite, UA NEGATIVE    Leukocytes, UA small (1+) (A) Negative  Wet prep, genital     Status: None   Collection Time: 04/11/16 10:08 AM  Result Value Ref Range   Yeast Wet Prep HPF POC NONE SEEN NONE SEEN   Trich, Wet Prep NONE SEEN NONE SEEN   Clue Cells Wet Prep HPF POC NONE SEEN NONE SEEN   WBC, Wet Prep HPF POC FEW NONE SEEN      ASSESSMENT/PLAN:   Treat as yeast infection given itching is main symptoms, I'm less convinced by the wet prep after seeing the vaginal exam, concern for possible cervicitis will need to r/o GC/CT though I think she is low risk as a married monogamous person, will also confirm yeast on thin prep sample. Previous Ucx show negative or insignificant growth with similar symptoms  ER/RTC precautions reviewed with respect to possible UTI and if this gets worse, PID possibility though I think unlikely at this point given no fever, no significant vaginal discharge other than what appears to be more related to yeast.  Previous notes mention that the patient was not interested in following up with hormone levels for PCO S, however due to continued irregular periods, LMP last week, and given current symptoms, patient would like to be tested for this. I noted that labs were already in the system for her, and stricture get these done next week around 8 AM given that testosterone, etc., will be checked  Known proteinuria  Vaginitis and vulvovaginitis - Plan: Cytology - PAP, Wet prep, genital  Vaginal candida - Plan: fluconazole (DIFLUCAN) 150 MG tablet, Wet prep, genital  Burning with urination - Plan: POCT Urinalysis Dipstick, Urine Culture  Pelvic pain in female  Proteinuria  Irregular periods/menstrual cycles    Visit summary with medication list and pertinent instructions was  printed for patient to review. All questions at time of visit were answered - patient instructed to contact office with any additional concerns. ER/RTC precautions were reviewed with the patient. Follow-up plan: Return if symptoms worsen or fail to improve, and based on labs/other test results please follow up w. PCP.  Note: Total time spent 25 minutes, greater than 50% of the visit was spent face-to-face counseling and coordinating care for the following: The primary encounter diagnosis was Vaginitis and vulvovaginitis. Diagnoses of Vaginal candida and Burning with urination were also pertinent to this visit.Marland Kitchen

## 2016-04-11 NOTE — Patient Instructions (Addendum)
Laura Robinson has already placed orders in the lab to test for hormones associated with polycystic ovarian syndrome. However, these labs need to be drawn early in the morning, as close to 8 AM as possible. I encourage you to come back next week to have this blood work done. In the meantime, we will schedule an ultrasound for further evaluation of lower abdominal pain in case this issue may have something to do with uterus or ovaries.  We will send the urine for culture, this should be back in 2-3 days. If urine is positive for infection, we will initiate the appropriate antibiotics at that time. Given discomfort on vaginal exam and irritated appearance to the cervix, we also collected a sample to confirm no other bacterial issue or trichomonas infection, and to confirm yeast or BV. For now I think we are looking at a significant vaginal yeast infection and we will treat as such with Diflucan. If your pain gets worse or if you develop fever or significant vaginal discharge, please seek care ASAP.   Summary of Plan:  For now treat as yeast infection, pick-up Diflucan from the pharmacy   We'll wait for further test results and adjust treatment if needed  Ultrasound to further evaluate lower abdominal pain/uterus and ovaries  Labs next week at 8 AM to further evaluate for polycystic ovarian syndrome

## 2016-04-13 LAB — URINE CULTURE
Colony Count: >=100000
Organism ID, Bacteria: 10000

## 2016-04-14 ENCOUNTER — Other Ambulatory Visit: Payer: Self-pay

## 2016-04-14 ENCOUNTER — Telehealth: Payer: Self-pay

## 2016-04-14 DIAGNOSIS — R102 Pelvic and perineal pain: Secondary | ICD-10-CM

## 2016-04-14 DIAGNOSIS — R1013 Epigastric pain: Secondary | ICD-10-CM

## 2016-04-14 NOTE — Telephone Encounter (Signed)
That's fine, whatever she needs

## 2016-04-14 NOTE — Telephone Encounter (Signed)
Bonnita Nasuti called stated that she needed a order for US Pelvic Complete per Protocol to go with the Transvaginal order that was placed by you previously. Please advise. Rhonda Cunningham,CMA

## 2016-04-15 LAB — CYTOLOGY - PAP

## 2016-04-15 NOTE — Telephone Encounter (Signed)
OK - I think the Epic change is causing some of the orders to auto-populate things like internal/external, though I'm pretty sure I ordered it to be done here - May be my error, I'll keep an eye on this issue and contact the Epic folks if it keeps happening. ANyway, order is in.

## 2016-04-15 NOTE — Telephone Encounter (Addendum)
Called Bonnita Nasuti in Imaging she stated that she need you to order US Pelvic complete. She stated that she can not add it it has to be ordered. She said it needs to be the external and right now you just only have it for the internal.Kye Silverstein,CMA

## 2016-04-15 NOTE — Addendum Note (Signed)
Addended by: Maryla Morrow on: 04/15/2016 10:00 AM   Modules accepted: Orders

## 2016-04-16 ENCOUNTER — Ambulatory Visit (INDEPENDENT_AMBULATORY_CARE_PROVIDER_SITE_OTHER): Payer: PRIVATE HEALTH INSURANCE

## 2016-04-16 DIAGNOSIS — D259 Leiomyoma of uterus, unspecified: Secondary | ICD-10-CM

## 2016-04-16 LAB — CERVICOVAGINAL ANCILLARY ONLY: CANDIDA VAGINITIS: POSITIVE — AB

## 2016-04-17 LAB — LIPID PANEL
CHOL/HDL RATIO: 4.1 ratio (ref <=5.0)
Cholesterol: 150 mg/dL (ref 125–200)
HDL: 37 mg/dL — AB (ref >=46)
LDL Cholesterol: 83 mg/dL (ref <130)
TRIGLYCERIDES: 150 mg/dL — AB (ref <150)
VLDL: 30 mg/dL (ref <30)

## 2016-04-17 LAB — TSH: TSH: 2.18 m[IU]/L

## 2016-04-23 LAB — TESTOS,TOTAL,FREE AND SHBG (FEMALE)

## 2016-04-25 LAB — DHEA

## 2016-04-25 LAB — PROLACTIN: PROLACTIN: 15.5 ng/mL

## 2016-04-25 LAB — TESTOSTERONE, FREE AND TOTAL (INCLUDES SHBG)-(MALES)

## 2016-05-01 ENCOUNTER — Telehealth: Payer: Self-pay | Admitting: Emergency Medicine

## 2016-05-01 DIAGNOSIS — E282 Polycystic ovarian syndrome: Secondary | ICD-10-CM

## 2016-05-01 NOTE — Telephone Encounter (Signed)
-----   Message from Donella Stade, [place] sent at 04/25/2016  8:23 AM EDT ----- Can we get patient to come get testosterone checked? That is the primary lab needed to distinguish PCOS.

## 2016-08-28 ENCOUNTER — Ambulatory Visit: Payer: PRIVATE HEALTH INSURANCE

## 2016-08-29 ENCOUNTER — Ambulatory Visit: Payer: PRIVATE HEALTH INSURANCE

## 2016-10-15 ENCOUNTER — Ambulatory Visit (INDEPENDENT_AMBULATORY_CARE_PROVIDER_SITE_OTHER): Payer: 59 | Admitting: Family Medicine

## 2016-10-15 ENCOUNTER — Encounter: Payer: Self-pay | Admitting: Family Medicine

## 2016-10-15 VITALS — BP 115/65 | HR 74 | Wt 224.0 lb

## 2016-10-15 DIAGNOSIS — S139XXA Sprain of joints and ligaments of unspecified parts of neck, initial encounter: Secondary | ICD-10-CM

## 2016-10-15 NOTE — Patient Instructions (Signed)
Thank you for coming in today. Continue heating pad.  Let me know if you want PT.  Return as needed.    Cervical Sprain A cervical sprain is a stretch or tear in one or more of the tough, cord-like tissues that connect bones (ligaments) in the neck. Cervical sprains can range from mild to severe. Severe cervical sprains can cause the spinal bones (vertebrae) in the neck to be unstable. This can lead to spinal cord damage and can result in serious nervous system problems. The amount of time that it takes for a cervical sprain to get better depends on the cause and extent of the injury. Most cervical sprains heal in 4-6 weeks. What are the causes? Cervical sprains may be caused by an injury (trauma), such as from a motor vehicle accident, a fall, or sudden forward and backward whipping movement of the head and neck (whiplash injury). Mild cervical sprains may be caused by wear and tear over time, such as from poor posture, sitting in a chair that does not provide support, or looking up or down for long periods of time. What increases the risk? The following factors may make you more likely to develop this condition:  Participating in activities that have a high risk of trauma to the neck. These include contact sports, auto racing, gymnastics, and diving.  Taking risks when driving or riding in a motor vehicle, such as speeding.  Having osteoarthritis of the spine.  Having poor strength and flexibility of the neck.  A previous neck injury.  Having poor posture.  Spending a lot of time in certain positions that put stress on the neck, such as sitting at a computer for long periods of time. What are the signs or symptoms? Symptoms of this condition include:  Pain, soreness, stiffness, tenderness, swelling, or a burning sensation in the front, back, or sides of the neck.  Sudden tightening of neck muscles that you cannot control (muscle spasms).  Pain in the shoulders or upper  back.  Limited ability to move the neck.  Headache.  Dizziness.  Nausea.  Vomiting.  Weakness, numbness, or tingling in a hand or an arm. Symptoms may develop right away after injury, or they may develop over a few days. In some cases, symptoms may go away with treatment and return (recur) over time. How is this diagnosed? This condition may be diagnosed based on:  Your medical history.  Your symptoms.  Any recent injuries or known neck problems that you have, such as arthritis in the neck.  A physical exam.  Imaging tests, such as:  X-rays.  MRI.  CT scan. How is this treated? This condition is treated by resting and icing the injured area and doing physical therapy exercises. Depending on the severity of your condition, treatment may also include:  Keeping your neck in place (immobilized) for periods of time. This may be done using:  A cervical collar. This supports your chin and the back of your head.  A cervical traction device. This is a sling that holds up your head. This removes weight and pressure from your neck, and it may help to relieve pain.  Medicines that help to relieve pain and inflammation.  Medicines that help to relax your muscles (muscle relaxants).  Surgery. This is rare. Follow these instructions at home: If you have a cervical collar:  Wear it as told by your health care provider. Do not remove the collar unless instructed by your health care provider.  Ask your  health care provider before you make any adjustments to your collar.  If you have long hair, keep it outside of the collar.  Ask your health care provider if you can remove the collar for cleaning and bathing. If you are allowed to remove the collar for cleaning or bathing:  Follow instructions from your health care provider about how to remove the collar safely.  Clean the collar by wiping it with mild soap and water and drying it completely.  If your collar has removable  pads, remove them every 1-2 days and wash them by hand with soap and water. Let them air-dry completely before you put them back in the collar.  Check your skin under the collar for irritation or sores. If you see any, tell your health care provider. Managing pain, stiffness, and swelling  If directed, use a cervical traction device as told by your health care provider.  If directed, apply heat to the affected area before you do your physical therapy or as often as told by your health care provider. Use the heat source that your health care provider recommends, such as a moist heat pack or a heating pad.  Place a towel between your skin and the heat source.  Leave the heat on for 20-30 minutes.  Remove the heat if your skin turns bright red. This is especially important if you are unable to feel pain, heat, or cold. You may have a greater risk of getting burned.  If directed, put ice on the affected area:  Put ice in a plastic bag.  Place a towel between your skin and the bag.  Leave the ice on for 20 minutes, 2-3 times a day. Activity  Do not drive while wearing a cervical collar. If you do not have a cervical collar, ask your health care provider if it is safe to drive while your neck heals.  Do not drive or use heavy machinery while taking prescription pain medicine or muscle relaxants, unless your health care provider approves.  Do not lift anything that is heavier than 10 lb (4.5 kg) until your health care provider tells you that it is safe.  Rest as directed by your health care provider. Avoid positions and activities that make your symptoms worse. Ask your health care provider what activities are safe for you.  If physical therapy was prescribed, do exercises as told by your health care provider or physical therapist. General instructions  Take over-the-counter and prescription medicines only as told by your health care provider.  Do not use any products that contain  nicotine or tobacco, such as cigarettes and e-cigarettes. These can delay healing. If you need help quitting, ask your health care provider.  Keep all follow-up visits as told by your health care provider or physical therapist. This is important. How is this prevented? To prevent a cervical sprain from happening again:  Use and maintain good posture. Make any needed adjustments to your workstation to help you use good posture.  Exercise regularly as directed by your health care provider or physical therapist.  Avoid risky activities that may cause a cervical sprain. Contact a health care provider if:  You have symptoms that get worse or do not get better after 2 weeks of treatment.  You have pain that gets worse or does not get better with medicine.  You develop new, unexplained symptoms.  You have sores or irritated skin on your neck from wearing your cervical collar. Get help right away if:  You have severe pain.  You develop numbness, tingling, or weakness in any part of your body.  You cannot move a part of your body (you have paralysis).  You have neck pain along with:  Severe dizziness.  Headache. Summary  A cervical sprain is a stretch or tear in one or more of the tough, cord-like tissues that connect bones (ligaments) in the neck.  Cervical sprains may be caused by an injury (trauma), such as from a motor vehicle accident, a fall, or sudden forward and backward whipping movement of the head and neck (whiplash injury).  Symptoms may develop right away after injury, or they may develop over a few days.  This condition is treated by resting and icing the injured area and doing physical therapy exercises. This information is not intended to replace advice given to you by your health care provider. Make sure you discuss any questions you have with your health care provider. Document Released: 06/15/2007 Document Revised: 04/16/2016 Document Reviewed:  04/16/2016 Elsevier Interactive Patient Education  2017 [employer].
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
## 2016-12-02 ENCOUNTER — Telehealth: Payer: Self-pay | Admitting: *Deleted

## 2016-12-02 NOTE — Telephone Encounter (Signed)
Have her come in for cmp and if everything looks good can start lamisil.

## 2016-12-02 NOTE — Telephone Encounter (Signed)
Pt left vm stating that she forgets to use the liquid toenail fungus medication on her toenail and wanted to know if you would send in the oral medication instead.

## 2016-12-04 NOTE — Telephone Encounter (Signed)
Spoke with pt this morning & she stated that she just had labs drawn last week by her urologist.  I called [facility] & they didn't have recent labs for her.  Notified pt & she is going to get the labs & call me back.

## 2017-01-16 LAB — HM PAP SMEAR: HM Pap smear: NEGATIVE

## 2017-06-16 ENCOUNTER — Ambulatory Visit: Payer: 59 | Admitting: Physician Assistant

## 2017-06-16 DIAGNOSIS — Z0189 Encounter for other specified special examinations: Secondary | ICD-10-CM

## 2018-09-10 ENCOUNTER — Encounter: Payer: Self-pay | Admitting: Physician Assistant

## 2018-09-10 ENCOUNTER — Ambulatory Visit (INDEPENDENT_AMBULATORY_CARE_PROVIDER_SITE_OTHER): Payer: 59 | Admitting: Physician Assistant

## 2018-09-10 VITALS — BP 112/68 | HR 73 | Ht 62.0 in | Wt 220.0 lb

## 2018-09-10 DIAGNOSIS — Z13220 Encounter for screening for lipoid disorders: Secondary | ICD-10-CM | POA: Diagnosis not present

## 2018-09-10 DIAGNOSIS — B351 Tinea unguium: Secondary | ICD-10-CM

## 2018-09-10 DIAGNOSIS — Z0000 Encounter for general adult medical examination without abnormal findings: Secondary | ICD-10-CM

## 2018-09-10 DIAGNOSIS — I10 Essential (primary) hypertension: Secondary | ICD-10-CM

## 2018-09-10 DIAGNOSIS — Z131 Encounter for screening for diabetes mellitus: Secondary | ICD-10-CM | POA: Diagnosis not present

## 2018-09-10 DIAGNOSIS — M5442 Lumbago with sciatica, left side: Secondary | ICD-10-CM

## 2018-09-10 MED ORDER — PREDNISONE 50 MG PO TABS: ORAL_TABLET | ORAL | 0 refills | Status: DC

## 2018-09-10 MED ORDER — PHENTERMINE HCL 37.5 MG PO TABS: 37.5000 mg | ORAL_TABLET | Freq: Every day | ORAL | 0 refills | Status: DC

## 2018-09-10 MED ORDER — TELMISARTAN 20 MG PO TABS: 20.0000 mg | ORAL_TABLET | Freq: Every day | ORAL | 3 refills | Status: DC

## 2018-09-10 MED ORDER — TERBINAFINE HCL 250 MG PO TABS: 250.0000 mg | ORAL_TABLET | Freq: Every day | ORAL | 1 refills | Status: AC

## 2018-09-10 NOTE — Progress Notes (Signed)
Subjective:     Laura Robinson is a 33 y.o. female and is here for a comprehensive physical exam. The patient reports problems - see below. .  Pt would like to lose weight. Right now she is unable to exercise due to left sided low back pain with radiation into left thigh. She does not remember any injury to back. She just woke up one morning and felt stiff. Now bending and lifting cause discomfort.   She also has dark, thick great and pinky toenail of right foot she would like treated.   Social History   Socioeconomic History  . Marital status: Married    Spouse name: Not on file  . Number of children: Not on file  . Years of education: Not on file  . Highest education level: Not on file  Occupational History  . Not on file  Social Needs  . Financial resource strain: Not on file  . Food insecurity:    Worry: Not on file    Inability: Not on file  . Transportation needs:    Medical: Not on file    Non-medical: Not on file  Tobacco Use  . Smoking status: Former Smoker    Packs/day: 0.25    Years: 4.00    Pack years: 1.00    Types: Cigarettes    Last attempt to quit: 07/14/2011    Years since quitting: 7.1  . Smokeless tobacco: Never Used  Substance and Sexual Activity  . Alcohol use: No    Comment: prior to pregnancy  . Drug use: No  . Sexual activity: Yes  Lifestyle  . Physical activity:    Days per week: Not on file    Minutes per session: Not on file  . Stress: Not on file  Relationships  . Social connections:    Talks on phone: Not on file    Gets together: Not on file    Attends religious service: Not on file    Active member of club or organization: Not on file    Attends meetings of clubs or organizations: Not on file    Relationship status: Not on file  . Intimate partner violence:    Fear of current or ex partner: Not on file    Emotionally abused: Not on file    Physically abused: Not on file    Forced sexual activity: Not on file  Other Topics Concern   . Not on file  Social History Narrative  . Not on file   Health Maintenance  Topic Date Due  . PAP SMEAR-Modifier  04/12/2019  . TETANUS/TDAP  02/23/2022  . INFLUENZA VACCINE  Completed  . HIV Screening  Completed    The following portions of the patient's history were reviewed and updated as appropriate: allergies, current medications, past family history, past medical history, past social history, past surgical history and problem list.  Review of Systems Pertinent items noted in HPI and remainder of comprehensive ROS otherwise negative.   Objective:    BP 112/68   Pulse 73   Ht 5\' 2"  (1.575 m)   Wt 220 lb (99.8 kg)   BMI 40.24 kg/m  General appearance: alert, cooperative and appears stated age Head: Normocephalic, without obvious abnormality, atraumatic Eyes: conjunctivae/corneas clear. PERRL, EOM's intact. Fundi benign. Ears: normal TM's and external ear canals both ears Nose: Nares normal. Septum midline. Mucosa normal. No drainage or sinus tenderness. Throat: lips, mucosa, and tongue normal; teeth and gums normal Neck: no adenopathy, no carotid bruit,  no JVD, supple, symmetrical, trachea midline and thyroid not enlarged, symmetric, no tenderness/mass/nodules Back: symmetric, no curvature. ROM normal. No CVA tenderness. Lungs: clear to auscultation bilaterally Heart: regular rate and rhythm, S1, S2 normal, no murmur, click, rub or gallop Abdomen: soft, non-tender; bowel sounds normal; no masses,  no organomegaly Extremities: extremities normal, atraumatic, no cyanosis or edema Pulses: 2+ and symmetric Skin: Skin color, texture, turgor normal. No rashes or lesions Lymph nodes: Cervical, supraclavicular, and axillary nodes normal. Neurologic: Alert and oriented X 3, normal strength and tone. Normal symmetric reflexes. Normal coordination and gait   great and pinky toenail dark, thick complete nail. No lumbar spinal tenderness.  NROM at waist.  Tenderness to palpate  left gluteal area and lumbar paraspinal muscles to the left.  Negative straight leg test.   Assessment:    Healthy female exam.      Plan:   Marland KitchenMarland KitchenIsatu was seen today for annual exam.  Diagnoses and all orders for this visit:  Routine physical examination -     COMPLETE METABOLIC PANEL WITH GFR -     Lipid Panel w/reflex Direct LDL -     TSH -     CBC with Differential/Platelet  Morbid obesity (HCC) -     TSH -     CBC with Differential/Platelet -     phentermine (ADIPEX-P) 37.5 MG tablet; Take 1 tablet (37.5 mg total) by mouth daily before breakfast.  Screening for lipid disorders -     Lipid Panel w/reflex Direct LDL  Screening for diabetes mellitus -     COMPLETE METABOLIC PANEL WITH GFR  Toenail fungus -     terbinafine (LAMISIL) 250 MG tablet; Take 1 tablet (250 mg total) by mouth daily.  Acute left-sided low back pain with left-sided sciatica -     predniSONE (DELTASONE) 50 MG tablet; Take one tablet for 5 days.  Essential hypertension -     telmisartan (MICARDIS) 20 MG tablet; Take 1 tablet (20 mg total) by mouth daily.   .. Depression screen [facility] 2/9 09/10/2018  Decreased Interest 1  Down, Depressed, Hopeless 0  PHQ - 2 Score 1  Altered sleeping 0  Tired, decreased energy 1  Change in appetite 1  Feeling bad or failure about yourself  0  Trouble concentrating 0  Moving slowly or fidgety/restless 0  Suicidal thoughts 0  PHQ-9 Score 3  Difficult doing work/chores Not difficult at all   .Marland Kitchen Discussed 150 minutes of exercise a week.  Encouraged vitamin D 1000 units and Calcium 1300mg  or 4 servings of dairy a day.  Fasting labs ordered.  Pap up to date.   Marland Kitchen.Discussed low carb diet with 1500 calories and 80g of protein.  Exercising at least 150 minutes a week.  My Fitness Pal could be a Microbiologist.  Discussed phentermine to start but needs to consider long term weight loss options.   Discussed lamisil for fungus. HO for prevention. Liver enzymes look  good on last labs. Discussed could elevate them.   Pt can't take NSAIDs due to kidney disease. Given burst of prednisone. Given exercises for low back and piriformis. Discussed symptomatic care with ice/heat/biofreeze.follow up with sports medicine.  See After Visit Summary for Counseling Recommendations

## 2018-09-10 NOTE — Patient Instructions (Addendum)
Health Maintenance, Female Adopting a healthy lifestyle and getting preventive care can go a long way to promote health and wellness. Talk with your health care provider about what schedule of regular examinations is right for you. This is a good chance for you to check in with your provider about disease prevention and staying healthy. In between checkups, there are plenty of things you can do on your own. Experts have done a lot of research about which lifestyle changes and preventive measures are most likely to keep you healthy. Ask your health care provider for more information. Weight and diet Eat a healthy diet  Be sure to include plenty of vegetables, fruits, low-fat dairy products, and lean protein.  Do not eat a lot of foods high in solid fats, added sugars, or salt.  Get regular exercise. This is one of the most important things you can do for your health. ? Most adults should exercise for at least 150 minutes each week. The exercise should increase your heart rate and make you sweat (moderate-intensity exercise). ? Most adults should also do strengthening exercises at least twice a week. This is in addition to the moderate-intensity exercise. Maintain a healthy weight  Body mass index (BMI) is a measurement that can be used to identify possible weight problems. It estimates body fat based on height and weight. Your health care provider can help determine your BMI and help you achieve or maintain a healthy weight.  For females 20 years of age and older: ? A BMI below 18.5 is considered underweight. ? A BMI of 18.5 to 24.9 is normal. ? A BMI of 25 to 29.9 is considered overweight. ? A BMI of 30 and above is considered obese. Watch levels of cholesterol and blood lipids  You should start having your blood tested for lipids and cholesterol at 32 years of age, then have this test every 5 years.  You may need to have your cholesterol levels checked more often if: ? Your lipid or  cholesterol levels are high. ? You are older than 32 years of age. ? You are at high risk for heart disease. Cancer screening Lung Cancer  Lung cancer screening is recommended for adults 55-80 years old who are at high risk for lung cancer because of a history of smoking.  A yearly low-dose CT scan of the lungs is recommended for people who: ? Currently smoke. ? Have quit within the past 15 years. ? Have at least a 30-pack-year history of smoking. A pack year is smoking an average of one pack of cigarettes a day for 1 year.  Yearly screening should continue until it has been 15 years since you quit.  Yearly screening should stop if you develop a health problem that would prevent you from having lung cancer treatment. Breast Cancer  Practice breast self-awareness. This means understanding how your breasts normally appear and feel.  It also means doing regular breast self-exams. Let your health care provider know about any changes, no matter how small.  If you are in your 20s or 30s, you should have a clinical breast exam (CBE) by a health care provider every 1-3 years as part of a regular health exam.  If you are 40 or older, have a CBE every year. Also consider having a breast X-ray (mammogram) every year.  If you have a family history of breast cancer, talk to your health care provider about genetic screening.  If you are at high risk for breast cancer, talk   to your health care provider about having an MRI and a mammogram every year.  Breast cancer gene (BRCA) assessment is recommended for women who have family members with BRCA-related cancers. BRCA-related cancers include: ? Breast. ? Ovarian. ? Tubal. ? Peritoneal cancers.  Results of the assessment will determine the need for genetic counseling and BRCA1 and BRCA2 testing. Cervical Cancer Your health care provider may recommend that you be screened regularly for cancer of the pelvic organs (ovaries, uterus, and vagina).  This screening involves a pelvic examination, including checking for microscopic changes to the surface of your cervix (Pap test). You may be encouraged to have this screening done every 3 years, beginning at age 21.  For women ages 30-65, health care providers may recommend pelvic exams and Pap testing every 3 years, or they may recommend the Pap and pelvic exam, combined with testing for human papilloma virus (HPV), every 5 years. Some types of HPV increase your risk of cervical cancer. Testing for HPV may also be done on women of any age with unclear Pap test results.  Other health care providers may not recommend any screening for nonpregnant women who are considered low risk for pelvic cancer and who do not have symptoms. Ask your health care provider if a screening pelvic exam is right for you.  If you have had past treatment for cervical cancer or a condition that could lead to cancer, you need Pap tests and screening for cancer for at least 20 years after your treatment. If Pap tests have been discontinued, your risk factors (such as having a new sexual partner) need to be reassessed to determine if screening should resume. Some women have medical problems that increase the chance of getting cervical cancer. In these cases, your health care provider may recommend more frequent screening and Pap tests. Colorectal Cancer  This type of cancer can be detected and often prevented.  Routine colorectal cancer screening usually begins at 32 years of age and continues through 32 years of age.  Your health care provider may recommend screening at an earlier age if you have risk factors for colon cancer.  Your health care provider may also recommend using home test kits to check for hidden blood in the stool.  A small camera at the end of a tube can be used to examine your colon directly (sigmoidoscopy or colonoscopy). This is done to check for the earliest forms of colorectal cancer.  Routine  screening usually begins at age 50.  Direct examination of the colon should be repeated every 5-10 years through 32 years of age. However, you may need to be screened more often if early forms of precancerous polyps or small growths are found. Skin Cancer  Check your skin from head to toe regularly.  Tell your health care provider about any new moles or changes in moles, especially if there is a change in a mole's shape or color.  Also tell your health care provider if you have a mole that is larger than the size of a pencil eraser.  Always use sunscreen. Apply sunscreen liberally and repeatedly throughout the day.  Protect yourself by wearing long sleeves, pants, a wide-brimmed hat, and sunglasses whenever you are outside. Heart disease, diabetes, and high blood pressure  High blood pressure causes heart disease and increases the risk of stroke. High blood pressure is more likely to develop in: ? People who have blood pressure in the high end of the normal range (130-139/85-89 mm Hg). ? People   who are overweight or obese. ? People who are African American.  If you are 84-22 years of age, have your blood pressure checked every 3-5 years. If you are 67 years of age or older, have your blood pressure checked every year. You should have your blood pressure measured twice-once when you are at a hospital or clinic, and once when you are not at a hospital or clinic. Record the average of the two measurements. To check your blood pressure when you are not at a hospital or clinic, you can use: ? An automated blood pressure machine at a pharmacy. ? A home blood pressure monitor.  If you are between 52 years and 3 years old, ask your health care provider if you should take aspirin to prevent strokes.  Have regular diabetes screenings. This involves taking a blood sample to check your fasting blood sugar level. ? If you are at a normal weight and have a low risk for diabetes, have this test once  every three years after 32 years of age. ? If you are overweight and have a high risk for diabetes, consider being tested at a younger age or more often. Preventing infection Hepatitis B  If you have a higher risk for hepatitis B, you should be screened for this virus. You are considered at high risk for hepatitis B if: ? You were born in a country where hepatitis B is common. Ask your health care provider which countries are considered high risk. ? Your parents were born in a high-risk country, and you have not been immunized against hepatitis B (hepatitis B vaccine). ? You have HIV or AIDS. ? You use needles to inject street drugs. ? You live with someone who has hepatitis B. ? You have had sex with someone who has hepatitis B. ? You get hemodialysis treatment. ? You take certain medicines for conditions, including cancer, organ transplantation, and autoimmune conditions. Hepatitis C  Blood testing is recommended for: ? Everyone born from [birth year] through 1964. ? Anyone with known risk factors for hepatitis C. Sexually transmitted infections (STIs)  You should be screened for sexually transmitted infections (STIs) including gonorrhea and chlamydia if: ? You are sexually active and are younger than 32 years of age. ? You are older than 32 years of age and your health care provider tells you that you are at risk for this type of infection. ? Your sexual activity has changed since you were last screened and you are at an increased risk for chlamydia or gonorrhea. Ask your health care provider if you are at risk.  If you do not have HIV, but are at risk, it may be recommended that you take a prescription medicine daily to prevent HIV infection. This is called pre-exposure prophylaxis (PrEP). You are considered at risk if: ? You are sexually active and do not regularly use condoms or know the HIV status of your partner(s). ? You take drugs by injection. ? You are sexually active with a partner  who has HIV. Talk with your health care provider about whether you are at high risk of being infected with HIV. If you choose to begin PrEP, you should first be tested for HIV. You should then be tested every 3 months for as long as you are taking PrEP. Pregnancy  If you are premenopausal and you may become pregnant, ask your health care provider about preconception counseling.  If you may become pregnant, take 400 to 800 micrograms (mcg) of folic acid every  day.  If you want to prevent pregnancy, talk to your health care provider about birth control (contraception). Osteoporosis and menopause  Osteoporosis is a disease in which the bones lose minerals and strength with aging. This can result in serious bone fractures. Your risk for osteoporosis can be identified using a bone density scan.  If you are 68 years of age or older, or if you are at risk for osteoporosis and fractures, ask your health care provider if you should be screened.  Ask your health care provider whether you should take a calcium or vitamin D supplement to lower your risk for osteoporosis.  Menopause may have certain physical symptoms and risks.  Hormone replacement therapy may reduce some of these symptoms and risks. Talk to your health care provider about whether hormone replacement therapy is right for you. Follow these instructions at home:  Schedule regular health, dental, and eye exams.  Stay current with your immunizations.  Do not use any tobacco products including cigarettes, chewing tobacco, or electronic cigarettes.  If you are pregnant, do not drink alcohol.  If you are breastfeeding, limit how much and how often you drink alcohol.  Limit alcohol intake to no more than 1 drink per day for nonpregnant women. One drink equals 12 ounces of beer, 5 ounces of wine, or 1 ounces of hard liquor.  Do not use street drugs.  Do not share needles.  Ask your health care provider for help if you need support  or information about quitting drugs.  Tell your health care provider if you often feel depressed.  Tell your health care provider if you have ever been abused or do not feel safe at home. This information is not intended to replace advice given to you by your health care provider. Make sure you discuss any questions you have with your health care provider. Document Released: 03/03/2011 Document Revised: 01/24/2016 Document Reviewed: 05/22/2015 Elsevier Interactive Patient Education  2019 [employer]   Preventing Toenail Fungus from Recurring   Sanitize your shoes with Mycomist spray or a similar shoe sanitizer spray.  Follow the instructions on the bottle and dry them outside in the sun or with a hairdryer.  We also recommend repeating the sanitization once weekly in shoes you wear most often.   Throw away any shoes you have worn a significant amount without socks-fungus thrives in a warm moist environment and you want to avoid re-infection after your laser procedure   Bleach your socks with regular or color safe bleach   Change your socks regularly to keep your feet clean and dry (especially if you have sweaty feet)-if sweaty feet are a problem, let your doctor know-there is a great lotion that helps with this problem.   Clean your toenail clippers with alcohol before you use them if you do your own toenails and make sure to replace [employer] boards and orange sticks regularly   If you get regular pedicures, bring your own instruments or go to a spa that sterilizes their instruments in an autoclave.   Piriformis Syndrome Rehab Ask your health care provider which exercises are safe for you. Do exercises exactly as told by your health care provider and adjust them as directed. It is normal to feel mild stretching, pulling, tightness, or discomfort as you do these exercises, but you should stop right away if you feel sudden pain or your pain gets worse.Do not begin these exercises until  told by your health care provider. Stretching and range of motion  exercises These exercises warm up your muscles and joints and improve the movement and flexibility of your hip and pelvis. These exercises also help to relieve pain, numbness, and tingling. Exercise A: Hip rotators  2. Lie on your back on a firm surface. 3. Pull your left / right knee toward your same shoulder with your left / right hand until your knee is pointing toward the ceiling. Hold your left / right ankle with your other hand. 4. Keeping your knee steady, gently pull your left / right ankle toward your other shoulder until you feel a stretch in your buttocks. 5. Hold this position for __________ seconds. Repeat __________ times. Complete this stretch __________ times a day. Exercise B: Hip extensors 2. Lie on your back on a firm surface. Both of your legs should be straight. 3. Pull your left / right knee to your chest. Hold your leg in this position by holding onto the back of your thigh or the front of your knee. 4. Hold this position for __________ seconds. 5. Slowly return to the starting position. Repeat __________ times. Complete this stretch __________ times a day. Strengthening exercises These exercises build strength and endurance in your hip and thigh muscles. Endurance is the ability to use your muscles for a long time, even after they get tired. Exercise C: Straight leg raises (hip abductors)  2. Lie on your side with your left / right leg in the top position. Lie so your head, shoulder, knee, and hip line up. Bend your bottom knee to help you balance. 3. Lift your top leg up 4-6 inches (10-15 cm), keeping your toes pointed straight ahead. 4. Hold this position for __________ seconds. 5. Slowly lower your leg to the starting position. Let your muscles relax completely. Repeat __________ times. Complete this exercise__________ times a day. Exercise D: Hip abductors and rotators, quadruped  2. Get on your  hands and knees on a firm, lightly padded surface. Your hands should be directly below your shoulders, and your knees should be directly below your hips. 3. Lift your left / right knee out to the side. Keep your knee bent. Do not twist your body. 4. Hold this position for __________ seconds. 5. Slowly lower your leg. Repeat __________ times. Complete this exercise__________ times a day. Exercise E: Straight leg raises (hip extensors) 2. Lie on your abdomen on a bed or a firm surface with a pillow under your hips. 3. Squeeze your buttock muscles and lift your left / right thigh off the bed. Do not let your back arch. 4. Hold this position for __________ seconds. 5. Slowly return to the starting position. Let your muscles relax completely before doing another repetition. Repeat __________ times. Complete this exercise__________ times a day. This information is not intended to replace advice given to you by your health care provider. Make sure you discuss any questions you have with your health care provider. Document Released: 08/18/2005 Document Revised: 04/22/2016 Document Reviewed: 07/31/2015 Elsevier Interactive Patient Education  2019 [employer].

## 2018-09-13 ENCOUNTER — Encounter: Payer: Self-pay | Admitting: Physician Assistant

## 2018-09-13 DIAGNOSIS — M5442 Lumbago with sciatica, left side: Secondary | ICD-10-CM | POA: Insufficient documentation

## 2018-09-16 LAB — COMPLETE METABOLIC PANEL WITH GFR
AG RATIO: 1.4 (calc) (ref 1.0–2.5)
ALBUMIN MSPROF: 3.8 g/dL (ref 3.6–5.1)
ALKALINE PHOSPHATASE (APISO): 63 U/L (ref 33–115)
ALT: 22 U/L (ref 6–29)
AST: 13 U/L (ref 10–30)
BILIRUBIN TOTAL: 0.3 mg/dL (ref 0.2–1.2)
BUN / CREAT RATIO: 17 (calc) (ref 6–22)
BUN: 31 mg/dL — ABNORMAL HIGH (ref 7–25)
CHLORIDE: 106 mmol/L (ref 98–110)
CO2: 27 mmol/L (ref 20–32)
Calcium: 9.2 mg/dL (ref 8.6–10.2)
Creat: 1.81 mg/dL — ABNORMAL HIGH (ref 0.50–1.10)
GFR, EST AFRICAN AMERICAN: 42 mL/min/{1.73_m2} — AB (ref >=60)
GFR, Est Non African American: 37 mL/min/{1.73_m2} — ABNORMAL LOW (ref >=60)
GLUCOSE: 89 mg/dL (ref 65–99)
Globulin: 2.8 g/dL (calc) (ref 1.9–3.7)
POTASSIUM: 4.4 mmol/L (ref 3.5–5.3)
SODIUM: 141 mmol/L (ref 135–146)
TOTAL PROTEIN: 6.6 g/dL (ref 6.1–8.1)

## 2018-09-16 LAB — CBC WITH DIFFERENTIAL/PLATELET
Absolute Monocytes: 635 cells/uL (ref 200–950)
BASOS PCT: 0.6 %
Basophils Absolute: 76 cells/uL (ref 0–200)
Eosinophils Absolute: 76 cells/uL (ref 15–500)
Eosinophils Relative: 0.6 %
HCT: 39.5 % (ref 35.0–45.0)
Hemoglobin: 13.2 g/dL (ref 11.7–15.5)
Lymphs Abs: 4750 cells/uL — ABNORMAL HIGH (ref 850–3900)
MCH: 28.4 pg (ref 27.0–33.0)
MCHC: 33.4 g/dL (ref 32.0–36.0)
MCV: 84.9 fL (ref 80.0–100.0)
MPV: 9.8 fL (ref 7.5–12.5)
Monocytes Relative: 5 %
Neutro Abs: 7163 cells/uL (ref 1500–7800)
Neutrophils Relative %: 56.4 %
Platelets: 313 10*3/uL (ref 140–400)
RBC: 4.65 10*6/uL (ref 3.80–5.10)
RDW: 13.4 % (ref 11.0–15.0)
TOTAL LYMPHOCYTE: 37.4 %
WBC: 12.7 10*3/uL — ABNORMAL HIGH (ref 3.8–10.8)

## 2018-09-16 LAB — TSH: TSH: 3.8 m[IU]/L

## 2018-09-16 LAB — LIPID PANEL W/REFLEX DIRECT LDL
CHOLESTEROL: 186 mg/dL (ref <200)
HDL: 39 mg/dL — ABNORMAL LOW (ref >50)
LDL Cholesterol (Calc): 113 mg/dL (calc) — ABNORMAL HIGH
Non-HDL Cholesterol (Calc): 147 mg/dL (calc) — ABNORMAL HIGH (ref <130)
Total CHOL/HDL Ratio: 4.8 (calc) (ref <5.0)
Triglycerides: 215 mg/dL — ABNORMAL HIGH (ref <150)

## 2018-09-20 ENCOUNTER — Encounter: Payer: Self-pay | Admitting: Physician Assistant

## 2018-09-20 DIAGNOSIS — E781 Pure hyperglyceridemia: Secondary | ICD-10-CM | POA: Insufficient documentation

## 2018-09-20 NOTE — Progress Notes (Signed)
Call pt: thyroid normal.  Kidney function up some from our last recheck. Do you have an upcoming nephrology appt?  Sugars and liver look good.  TG elevated more than 2 years ago. Start fish oil 4000mg  daily.   Your white count was elevated. Were you coming off feeling sick? How do you feel today?

## 2018-09-23 ENCOUNTER — Encounter: Payer: Self-pay | Admitting: Physician Assistant

## 2018-09-29 ENCOUNTER — Encounter: Payer: Self-pay | Admitting: Osteopathic Medicine

## 2018-09-29 ENCOUNTER — Ambulatory Visit (INDEPENDENT_AMBULATORY_CARE_PROVIDER_SITE_OTHER): Payer: 59 | Admitting: Osteopathic Medicine

## 2018-09-29 VITALS — BP 120/64 | HR 73 | Temp 98.7°F | Wt 215.9 lb

## 2018-09-29 DIAGNOSIS — R102 Pelvic and perineal pain: Secondary | ICD-10-CM

## 2018-09-29 DIAGNOSIS — R1030 Lower abdominal pain, unspecified: Secondary | ICD-10-CM | POA: Diagnosis not present

## 2018-09-29 LAB — POCT URINALYSIS DIPSTICK
Bilirubin, UA: NEGATIVE
Blood, UA: NEGATIVE
Glucose, UA: NEGATIVE
Ketones, UA: NEGATIVE
NITRITE UA: NEGATIVE
PROTEIN UA: POSITIVE — AB
Spec Grav, UA: 1.015 (ref 1.010–1.025)
Urobilinogen, UA: 0.2 E.U./dL
pH, UA: 7 (ref 5.0–8.0)

## 2018-09-29 LAB — POCT URINE PREGNANCY: Preg Test, Ur: NEGATIVE

## 2018-09-29 MED ORDER — FLUCONAZOLE 150 MG PO TABS: 150.0000 mg | ORAL_TABLET | Freq: Once | ORAL | 1 refills | Status: AC

## 2018-09-29 MED ORDER — METRONIDAZOLE 500 MG PO TABS: 500.0000 mg | ORAL_TABLET | Freq: Two times a day (BID) | ORAL | 0 refills | Status: AC

## 2018-09-29 NOTE — Progress Notes (Signed)
HPI: Laura Robinson is a 33 y.o. female who  has a past medical history of Gestational diabetes mellitus in childbirth, diet controlled, Obesity, Pregnancy induced hypertension, and Preterm labor.  she presents to [facility] today, 09/29/18,  for chief complaint of:  Pelvic discomfort  . Location: lower pelvis maybe a bit to the L . Quality: "I really can't describe it" even w/ suggestions such as sharp or dull, throbbing, stabbing, burning, etc . Duration: about a week . Severity: "light" . Timing: comes and goes, lasts maybe 30 mins when it comes but she's not sure  . Modifying factors: nothing makes better or worse  . Assoc signs/symptoms: no dysuria or hematuria. Maybe some increased vaginal discharge.  Marland Kitchen LMP normal 12/?/19, some spotting 09/04/18    At today's visit 09/29/18 ... PMH, PSH, FH reviewed and updated as needed.  Current medication list and allergy/intolerance hx reviewed and updated as needed. (See remainder of HPI, ROS, Phys Exam below)   No results found.  Results for orders placed or performed in visit on 09/29/18 (from the past 72 hour(s))  POCT Urinalysis Dipstick     Status: Abnormal   Collection Time: 09/29/18  3:05 PM  Result Value Ref Range   Color, UA YELLOW    Clarity, UA CLEAR    Glucose, UA Negative Negative   Bilirubin, UA NEGATIVE    Ketones, UA NEGATIVE    Spec Grav, UA 1.015 1.010 - 1.025   Blood, UA NEGATIVE    pH, UA 7.0 5.0 - 8.0   Protein, UA Positive (A) Negative    Comment: 100 MG/G   Urobilinogen, UA 0.2 0.2 or 1.0 E.U./dL   Nitrite, UA NEGATIVE    Leukocytes, UA Trace (A) Negative   Appearance     Odor    POCT urine pregnancy     Status: None   Collection Time: 09/29/18  3:05 PM  Result Value Ref Range   Preg Test, Ur Negative Negative            ASSESSMENT/PLAN: The primary encounter diagnosis was Pelvic pain. A diagnosis of Pain radiating to lower abdomen was also pertinent to  this visit.   Orders Placed This Encounter  Procedures  . Urine Culture  . WET PREP FOR [place], YEAST, CLUE  . Urinalysis, microscopic only  . POCT Urinalysis Dipstick  . POCT urine pregnancy     Meds ordered this encounter  Medications  . metroNIDAZOLE (FLAGYL) 500 MG tablet    Sig: Take 1 tablet (500 mg total) by mouth 2 (two) times daily for 7 days.    Dispense:  14 tablet    Refill:  0  . fluconazole (DIFLUCAN) 150 MG tablet    Sig: Take 1 tablet (150 mg total) by mouth once for 1 dose. Repeat dose 72 hours if yeast infection persists    Dispense:  2 tablet    Refill:  1    Patient Instructions  Will try treatment for yeast and BV Will await urine culture to evaluate possible UTI - may take 2-3 days to result  Will get ultrasound if symptoms persist        Follow-up plan: Return if symptoms worsen or fail to improve.                                                 ################################################# ################################################# ################################################# #################################################  Current Meds  Medication Sig  . Cholecalciferol (VITAMIN D-3 PO) Take by mouth daily.  Marland Kitchen telmisartan (MICARDIS) 20 MG tablet Take 1 tablet (20 mg total) by mouth daily.  Marland Kitchen terbinafine (LAMISIL) 250 MG tablet Take 1 tablet (250 mg total) by mouth daily.    Allergies  Allergen Reactions  . Ibuprofen     Not to take due to kidney disease.         Review of Systems:  Constitutional: No recent illness  HEENT: No  headache, no vision change  Cardiac: No  chest pain, No  pressure, No palpitations  Respiratory:  No  shortness of breath.   Gastrointestinal: +lower abdominal pain, no change on bowel habits, no nausea  Musculoskeletal: No new myalgia/arthralgia  Skin: No  Rash  Hem/Onc: No  easy bruising/bleeding  Neurologic: No   weakness   Exam:  BP 120/64 (BP Location: Left Arm, Patient Position: Sitting, Cuff Size: Normal)   Pulse 73   Temp 98.7 F (37.1 C) (Oral)   Wt 215 lb 14.4 oz (97.9 kg)   BMI 39.49 kg/m   Constitutional: VS see above. General Appearance: alert, well-developed, well-nourished, NAD  Eyes: Normal lids and conjunctive, non-icteric sclera  Ears, Nose, Mouth, Throat: MMM, Normal external inspection ears/nares/mouth/lips/gums.  Neck: No masses, trachea midline.   Respiratory: Normal respiratory effort.   Musculoskeletal: Gait normal. Symmetric and independent movement of all extremities  Abdominal: non-tender, non-distended, no appreciable organomegaly, neg Murphy's, BS WNLx4  Neurological: Normal balance/coordination. No tremor.  Skin: warm, dry, intact.   Psychiatric: Normal judgment/insight. Normal mood and affect. Oriented x3.   GYN: No lesions/ulcers to external genitalia, normal urethra, normal vaginal mucosa, physiologic discharge appears white and perhaps a bit thicker than normal, cervix normal without lesions, uterus not enlarged or tender, adnexa no masses and nontender       Visit summary with medication list and pertinent instructions was printed for patient to review, patient was advised to alert [place] if any updates are needed. All questions at time of visit were answered - patient instructed to contact office with any additional concerns. ER/RTC precautions were reviewed with the patient and understanding verbalized.      Please note: voice recognition software was used to produce this document, and typos may escape review. Please contact Dr. Sheppard Coil for any needed clarifications.    Follow up plan: Return if symptoms worsen or fail to improve.

## 2018-09-29 NOTE — Patient Instructions (Signed)
Will try treatment for yeast and BV Will await urine culture to evaluate possible UTI - may take 2-3 days to result  Will get ultrasound if symptoms persist

## 2018-09-30 ENCOUNTER — Ambulatory Visit (INDEPENDENT_AMBULATORY_CARE_PROVIDER_SITE_OTHER): Payer: 59

## 2018-09-30 ENCOUNTER — Ambulatory Visit (INDEPENDENT_AMBULATORY_CARE_PROVIDER_SITE_OTHER): Payer: 59 | Admitting: Family Medicine

## 2018-09-30 ENCOUNTER — Encounter: Payer: Self-pay | Admitting: Family Medicine

## 2018-09-30 VITALS — BP 113/74 | HR 69 | Wt 215.0 lb

## 2018-09-30 DIAGNOSIS — N051 Unspecified nephritic syndrome with focal and segmental glomerular lesions: Secondary | ICD-10-CM

## 2018-09-30 DIAGNOSIS — M10371 Gout due to renal impairment, right ankle and foot: Secondary | ICD-10-CM

## 2018-09-30 DIAGNOSIS — M25572 Pain in left ankle and joints of left foot: Secondary | ICD-10-CM

## 2018-09-30 DIAGNOSIS — N183 Chronic kidney disease, stage 3 unspecified: Secondary | ICD-10-CM

## 2018-09-30 DIAGNOSIS — M79672 Pain in left foot: Secondary | ICD-10-CM

## 2018-09-30 LAB — WET PREP FOR TRICH, YEAST, CLUE
MICRO NUMBER: 122392
Specimen Quality: ADEQUATE

## 2018-09-30 MED ORDER — HYDROCODONE-ACETAMINOPHEN 5-325 MG PO TABS: 1.0000 | ORAL_TABLET | Freq: Four times a day (QID) | ORAL | 0 refills | Status: DC | PRN

## 2018-09-30 MED ORDER — COLCHICINE 0.6 MG PO CAPS: 1.0000 | ORAL_CAPSULE | Freq: Every day | ORAL | 1 refills | Status: DC

## 2018-09-30 MED ORDER — COLCHICINE 0.6 MG PO TABS: 0.3000 mg | ORAL_TABLET | Freq: Every day | ORAL | 2 refills | Status: DC

## 2018-09-30 NOTE — Patient Instructions (Signed)
Thank you for coming in today. I think this is gout.  Take colchicine 1/2 pill daily for 7-14 days.  Use hydrocodone for severe pain.  Use the walking boot as needed  Recheck in 2 weeks or sooner if needed.   Get labs today.    Gout  Gout is a condition that causes painful swelling of the joints. Gout is a type of inflammation of the joints (arthritis). This condition is caused by having too much uric acid in the body. Uric acid is a chemical that forms when the body breaks down substances called purines. Purines are important for building body proteins. When the body has too much uric acid, sharp crystals can form and build up inside the joints. This causes pain and swelling. Gout attacks can happen quickly and may be very painful (acute gout). Over time, the attacks can affect more joints and become more frequent (chronic gout). Gout can also cause uric acid to build up under the skin and inside the kidneys. What are the causes? This condition is caused by too much uric acid in your blood. This can happen because:  Your kidneys do not remove enough uric acid from your blood. This is the most common cause.  Your body makes too much uric acid. This can happen with some cancers and cancer treatments. It can also occur if your body is breaking down too many red blood cells (hemolytic anemia).  You eat too many foods that are high in purines. These foods include organ meats and some seafood. Alcohol, especially beer, is also high in purines. A gout attack may be triggered by trauma or stress. What increases the risk? You are more likely to develop this condition if you:  Have a family history of gout.  Are female and middle-aged.  Are female and have gone through menopause.  Are obese.  Frequently drink alcohol, especially beer.  Are dehydrated.  Lose weight too quickly.  Have an organ transplant.  Have lead poisoning.  Take certain medicines, including aspirin, cyclosporine,  diuretics, levodopa, and niacin.  Have kidney disease.  Have a skin condition called psoriasis. What are the signs or symptoms? An attack of acute gout happens quickly. It usually occurs in just one joint. The most common place is the big toe. Attacks often start at night. Other joints that may be affected include joints of the feet, ankle, knee, fingers, wrist, or elbow. Symptoms of this condition may include:  Severe pain.  Warmth.  Swelling.  Stiffness.  Tenderness. The affected joint may be very painful to touch.  Shiny, red, or purple skin.  Chills and fever. Chronic gout may cause symptoms more frequently. More joints may be involved. You may also have white or yellow lumps (tophi) on your hands or feet or in other areas near your joints. How is this diagnosed? This condition is diagnosed based on your symptoms, medical history, and physical exam. You may have tests, such as:  Blood tests to measure uric acid levels.  Removal of joint fluid with a thin needle (aspiration) to look for uric acid crystals.  X-rays to look for joint damage. How is this treated? Treatment for this condition has two phases: treating an acute attack and preventing future attacks. Acute gout treatment may include medicines to reduce pain and swelling, including:  NSAIDs.  Steroids. These are strong anti-inflammatory medicines that can be taken by mouth (orally) or injected into a joint.  Colchicine. This medicine relieves pain and swelling when it  is taken soon after an attack. It can be given by mouth or through an IV. Preventive treatment may include:  Daily use of smaller doses of NSAIDs or colchicine.  Use of a medicine that reduces uric acid levels in your blood.  Changes to your diet. You may need to see a dietitian about what to eat and drink to prevent gout. Follow these instructions at home: During a gout attack   If directed, put ice on the affected area: ? Put ice in a  plastic bag. ? Place a towel between your skin and the bag. ? Leave the ice on for 20 minutes, 2-3 times a day.  Raise (elevate) the affected joint above the level of your heart as often as possible.  Rest the joint as much as possible. If the affected joint is in your leg, you may be given crutches to use.  Follow instructions from your health care provider about eating or drinking restrictions. Avoiding future gout attacks  Follow a low-purine diet as told by your dietitian or health care provider. Avoid foods and drinks that are high in purines, including liver, kidney, anchovies, asparagus, herring, mushrooms, mussels, and beer.  Maintain a healthy weight or lose weight if you are overweight. If you want to lose weight, talk with your health care provider. It is important that you do not lose weight too quickly.  Start or maintain an exercise program as told by your health care provider. Eating and drinking  Drink enough fluids to keep your urine pale yellow.  If you drink alcohol: ? Limit how much you use to:  0-1 drink a day for women.  0-2 drinks a day for men. ? Be aware of how much alcohol is in your drink. In the [place] one drink equals one 12 oz bottle of beer (355 mL) one 5 oz glass of wine (148 mL), or one 1 oz glass of hard liquor (44 mL). General instructions  Take over-the-counter and prescription medicines only as told by your health care provider.  Do not drive or use heavy machinery while taking prescription pain medicine.  Return to your normal activities as told by your health care provider. Ask your health care provider what activities are safe for you.  Keep all follow-up visits as told by your health care provider. This is important. Contact a health care provider if you have:  Another gout attack.  Continuing symptoms of a gout attack after 10 days of treatment.  Side effects from your medicines.  Chills or a fever.  Burning pain when you  urinate.  Pain in your lower back or belly. Get help right away if you:  Have severe or uncontrolled pain.  Cannot urinate. Summary  Gout is painful swelling of the joints caused by inflammation.  The most common site of pain is the big toe, but it can affect other joints in the body.  Medicines and dietary changes can help to prevent and treat gout attacks. This information is not intended to replace advice given to you by your health care provider. Make sure you discuss any questions you have with your health care provider. Document Released: 08/15/2000 Document Revised: 03/10/2018 Document Reviewed: 03/10/2018 Elsevier Interactive Patient Education  2019 [employer].

## 2018-09-30 NOTE — Progress Notes (Signed)
Laura Robinson is a 33 y.o. female who presents to [facility] today for left foot pain. In 2017, Laura Robinson was seen for similar left foot pain, and she was treated for metatarsal stress reaction vs stress fracture with postoperative shoe and crutches. [name] reports that she recovered well.   She was in her normal state of health yesterday.  Last night, she felt some slight pain on the top of her left foot when walking to bed. At 0200, she was awakened with "unbearable pain" in her left foot. She is unable to bear weight or move her foot at all. She has not been doing any physical activity lately; no injuries or falls. She used her postoperative boot to get to the office today, and she was using a wheelchair.  She the pain is primarily located at the anterior ankle and through the midfoot to the plantar midfoot.  Pain is worse with activity and motion.  No fevers chills nausea vomiting or diarrhea.  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 She notes occasionally having some swelling and pain in her big toe.    ROS:  As above  Exam:  BP 113/74   Pulse 69   Wt 215 lb (97.5 kg)   BMI 39.32 kg/m  Wt Readings from Last 5 Encounters:  09/30/18 215 lb (97.5 kg)  09/29/18 215 lb 14.4 oz (97.9 kg)  09/10/18 220 lb (99.8 kg)  10/15/16 224 lb (101.6 kg)  04/11/16 204 lb (92.5 kg)   General: Well Developed, well nourished, and in no acute distress.  Neuro/Psych: Alert and oriented x3, extra-ocular muscles intact, able to move all 4 extremities, sensation grossly intact. Skin: Warm and dry, no rashes noted.  Respiratory: Not using accessory muscles, speaking in full sentences, trachea midline.  Cardiovascular: Pulses palpable, no extremity edema. Abdomen: Does not appear distended. MSK:  Left foot and ankle: Left ankle mildly swollen with no significantly large effusion.  No erythema.  Tender to palpation lateral anterior ankle.   Decreased ankle motion due to pain.  Strength is present.  Stable ligamentous exam. Left foot normal-appearing without significant effusion or swelling.  No erythema.  Foot is not particularly tender along the dorsal foot.  Mildly tender to plantar midfoot.  Not particular tender at plantar calcaneus. Pulses cap refill and sensation are intact distally.  Lab and Radiology Results X-ray images left foot and ankle personally independently reviewed.  No acute fractures.  Mild plantar calcaneal spur present.  Minimal degenerative changes. Await formal radiology review   Left foot x-ray March 2017: CLINICAL DATA:  Lateral foot pain.  EXAM: LEFT FOOT - COMPLETE 3+ VIEW  COMPARISON:  No recent prior.  FINDINGS: No acute bony or joint abnormality identified. No evidence fracture or dislocation. Soft tissue structures are unremarkable.  IMPRESSION: No acute or focal abnormality.   Electronically Signed   By: Marcello Moores  Register   On: 11/22/2015 15:11 I personally (independently) visualized and performed the interpretation of the images attached in this note.  Lab Results  Component Value Date   LABURIC 9.7 (H) 01/31/2012     Chemistry      Component Value Date/Time   NA 141 09/16/2018 0830   NA 136 (A) 07/25/2015   NA 141 06/26/2015   K 4.4 09/16/2018 0830   K 4.6 06/26/2015   CL 106 09/16/2018 0830   CL 104 07/25/2015   CO2 27 09/16/2018 0830   CO2 22 07/25/2015  BUN 31 (H) 09/16/2018 0830   BUN 27 (A) 07/25/2015   BUN 21 06/26/2015   CREATININE 1.81 (H) 09/16/2018 0830   GLU 95 07/25/2015      Component Value Date/Time   CALCIUM 9.2 09/16/2018 0830   CALCIUM 9.2 07/25/2015   ALKPHOS 81 12/13/2015 0923   AST 13 09/16/2018 0830   ALT 22 09/16/2018 0830   BILITOT 0.3 09/16/2018 0830        Assessment and Plan: 33 y.o. female with  Left foot pain: Laura Robinson is presenting today with severe left ankle and foot pain. She is unable to bear weight or move her foot.  This is unlikely to be a fracture, as she has not had any injuries and her foot x-ray looked normal. She most likely has gout, as the pain started suddenly and she has a history of elevated uric acid. Prescribed colchicine  for 7-14 days; at a lower dose due to her kidney function. Prescribed hydrocodone for severe pain. Use cam walker boot as needed. Follow-up on labs - CBC, uric acid, and renal function panel. Follow-up in 2 weeks or sooner if needed.    PDMP reviewed during this encounter. Orders Placed This Encounter  Procedures  . DG Foot Complete Left    Order Specific Question:   Reason for Exam (SYMPTOM  OR DIAGNOSIS REQUIRED)    Answer:   Left foot pain    Order Specific Question:   Is patient pregnant?    Answer:   Unknown (Please Explain)    Order Specific Question:   Preferred imaging location?    Answer:   [facility]    Order Specific Question:   Radiology Contrast Protocol - do NOT remove file path    Answer:   \\[email or web address]\Radiant\DXFluoroContrastProtocols.pdf  . DG Ankle Complete Left    Standing Status:   Future    Number of Occurrences:   1    Standing Expiration Date:   11/29/2019    Order Specific Question:   Reason for Exam (SYMPTOM  OR DIAGNOSIS REQUIRED)    Answer:   eval pain left ankle    Order Specific Question:   Is patient pregnant?    Answer:   No    Order Specific Question:   Preferred imaging location?    Answer:   [facility]    Order Specific Question:   Radiology Contrast Protocol - do NOT remove file path    Answer:   \\[email or web address]\[email or web address]\Radiant\DXFluoroContrastProtocols.pdf  . Uric acid  . CBC  . Renal Function Panel   Meds ordered this encounter  Medications  . colchicine 0.6 MG tablet    Sig: Take 0.5 tablets (0.3 mg total) by mouth daily.    Dispense:  30 tablet    Refill:  2  . HYDROcodone-acetaminophen (NORCO/VICODIN) 5-325 MG tablet    Sig: Take 1 tablet by mouth every 6 (six) hours as needed.    Dispense:   15 tablet    Refill:  0  . Colchicine 0.6 MG CAPS    Sig: Take 1 capsule by mouth daily.    Dispense:  30 capsule    Refill:  1    Historical information moved to improve visibility of documentation.  Past Medical History:  Diagnosis Date  . CKD (chronic kidney disease), stage III ([place]) 11/14/2014  . FSGS (focal segmental glomerulosclerosis) 11/14/2014   Dr. Posey Pronto, [facility]  With glemerulonephritis.    . Gestational diabetes mellitus in childbirth, diet controlled   . Obesity   .  Pregnancy induced hypertension   . Preterm labor    Past Surgical History:  Procedure Laterality Date  . NO PAST SURGERIES     Social History   Tobacco Use  . Smoking status: Former Smoker    Packs/day: 0.25    Years: 4.00    Pack years: 1.00    Types: Cigarettes    Last attempt to quit: 07/14/2011    Years since quitting: 7.2  . Smokeless tobacco: Never Used  Substance Use Topics  . Alcohol use: No    Comment: prior to pregnancy   family history includes Alcohol abuse in her paternal grandfather; Asthma in her brother; Cancer in her maternal grandmother; Diabetes in her father; Hypertension in her brother, father, maternal grandfather, maternal grandmother, paternal grandfather, and paternal grandmother; Kidney disease in her brother.  Medications: Current Outpatient Medications  Medication Sig Dispense Refill  . Cholecalciferol (VITAMIN D-3 PO) Take by mouth daily.    . metroNIDAZOLE (FLAGYL) 500 MG tablet Take 1 tablet (500 mg total) by mouth 2 (two) times daily for 7 days. 14 tablet 0  . phentermine (ADIPEX-P) 37.5 MG tablet Take 1 tablet (37.5 mg total) by mouth daily before breakfast. 30 tablet 0  . telmisartan (MICARDIS) 20 MG tablet Take 1 tablet (20 mg total) by mouth daily. 90 tablet 3  . terbinafine (LAMISIL) 250 MG tablet Take 1 tablet (250 mg total) by mouth daily. 90 tablet 1  . Colchicine 0.6 MG CAPS Take 1 capsule by mouth daily. 30 capsule 1  . colchicine 0.6 MG tablet  Take 0.5 tablets (0.3 mg total) by mouth daily. 30 tablet 2  . HYDROcodone-acetaminophen (NORCO/VICODIN) 5-325 MG tablet Take 1 tablet by mouth every 6 (six) hours as needed. 15 tablet 0   No current facility-administered medications for this visit.    Allergies  Allergen Reactions  . Ibuprofen     Not to take due to kidney disease.        Discussed warning signs or symptoms. Please see discharge instructions. Patient expresses understanding.  I personally was present and performed or re-performed the history, physical exam and medical decision-making activities of this service and have verified that the service and findings are accurately documented in the student's note. ___________________________________________ Lynne Leader M.D., ABFM., CAQSM. Primary Care and Sports Medicine Adjunct Instructor of [employer] [facility] [employer]

## 2018-10-01 ENCOUNTER — Telehealth: Payer: Self-pay | Admitting: Physician Assistant

## 2018-10-01 LAB — CBC
HEMATOCRIT: 41.1 % (ref 35.0–45.0)
Hemoglobin: 13.7 g/dL (ref 11.7–15.5)
MCH: 28.5 pg (ref 27.0–33.0)
MCHC: 33.3 g/dL (ref 32.0–36.0)
MCV: 85.4 fL (ref 80.0–100.0)
MPV: 10 fL (ref 7.5–12.5)
Platelets: 297 10*3/uL (ref 140–400)
RBC: 4.81 10*6/uL (ref 3.80–5.10)
RDW: 13.1 % (ref 11.0–15.0)
WBC: 7.5 10*3/uL (ref 3.8–10.8)

## 2018-10-01 LAB — URINE CULTURE
MICRO NUMBER:: 122365
SPECIMEN QUALITY:: ADEQUATE

## 2018-10-01 LAB — URINALYSIS, MICROSCOPIC ONLY
Bacteria, UA: NONE SEEN /HPF
Hyaline Cast: NONE SEEN /LPF
RBC / HPF: NONE SEEN /HPF (ref 0–2)

## 2018-10-01 LAB — RENAL FUNCTION PANEL
Albumin: 4.4 g/dL (ref 3.6–5.1)
BUN/Creatinine Ratio: 12 (calc) (ref 6–22)
BUN: 20 mg/dL (ref 7–25)
CO2: 22 mmol/L (ref 20–32)
Calcium: 9.7 mg/dL (ref 8.6–10.2)
Chloride: 109 mmol/L (ref 98–110)
Creat: 1.68 mg/dL — ABNORMAL HIGH (ref 0.50–1.10)
Glucose, Bld: 97 mg/dL (ref 65–99)
Phosphorus: 3.4 mg/dL (ref 2.5–4.5)
Potassium: 4.7 mmol/L (ref 3.5–5.3)
Sodium: 142 mmol/L (ref 135–146)

## 2018-10-01 LAB — URIC ACID: Uric Acid, Serum: 9.9 mg/dL — ABNORMAL HIGH (ref 2.5–7.0)

## 2018-10-01 MED ORDER — ALLOPURINOL 100 MG PO TABS: 100.0000 mg | ORAL_TABLET | Freq: Every day | ORAL | 6 refills | Status: DC

## 2018-10-01 NOTE — Addendum Note (Signed)
Addended by: Gregor Hams on: 10/01/2018 07:07 AM   Modules accepted: Orders

## 2018-10-01 NOTE — Telephone Encounter (Signed)
Received fax from [facility] that Colchicine was approved from 10/01/2018 through 10/02/2019 Pharmacy notified and forms sent to scan.   Reference ID: [id number]

## 2018-11-22 ENCOUNTER — Other Ambulatory Visit: Payer: Self-pay | Admitting: Family Medicine

## 2018-11-26 ENCOUNTER — Telehealth: Payer: 59 | Admitting: Nurse Practitioner

## 2018-11-26 DIAGNOSIS — M545 Low back pain, unspecified: Secondary | ICD-10-CM

## 2018-11-26 NOTE — Progress Notes (Signed)
Based on what you shared with me it looks like you have back pain that could be from kidney infection,that should be evaluated in a face to face office visit. ypu need to go somewhere that you can have a urinalysis done.   NOTE: If you entered your credit card information for this eVisit, you will not be charged. You may see a "hold" on your card for the $30 but that hold will drop off and you will not have a charge processed.  If you are having a true medical emergency please call 911.  If you need an urgent face to face visit,  has four urgent care centers for your convenience.  If you need care fast and have a high deductible or no insurance consider:   [email or web address] to reserve your spot online an avoid wait times  [facility] [address], [address] [place], [place] [address] 8 am to 8 pm Monday-Friday 10 am to 4 pm Saturday-Sunday *Across the street from [employer]  [place], [address] 8 am to 5 pm Monday-Friday * In the [facility] on the [facility]   The following sites will take your  insurance:  . [facility] [place] a Provider at this Location  [address] [place], [place] [address] . 10 am to 8 pm Monday-Friday . 12 pm to 8 pm Saturday-Sunday   . [facility] at [place] a Provider at this Location  [address], [place], [place] [address] . 8 am to 8 pm Monday-Friday . 9 am to 6 pm Saturday . 11 am to 6 pm Sunday   . [facility] at [place] Get Driving Directions  [address] [place],  [address] . 8 am to 8 pm Monday-Friday . 8 am to 4 pm Saturday-Sunday   Your e-visit answers were reviewed by a board certified advanced clinical practitioner to complete your personal care  plan.

## 2018-11-29 ENCOUNTER — Ambulatory Visit: Payer: 59 | Admitting: Physician Assistant

## 2018-12-07 ENCOUNTER — Encounter: Payer: Self-pay | Admitting: Physician Assistant

## 2019-02-17 ENCOUNTER — Other Ambulatory Visit: Payer: Self-pay | Admitting: Family Medicine

## 2019-02-25 ENCOUNTER — Telehealth (INDEPENDENT_AMBULATORY_CARE_PROVIDER_SITE_OTHER): Payer: 59 | Admitting: Family Medicine

## 2019-02-25 ENCOUNTER — Other Ambulatory Visit: Payer: 59

## 2019-02-25 ENCOUNTER — Telehealth: Payer: Self-pay | Admitting: *Deleted

## 2019-02-25 ENCOUNTER — Encounter: Payer: Self-pay | Admitting: Family Medicine

## 2019-02-25 DIAGNOSIS — Z20822 Contact with and (suspected) exposure to covid-19: Secondary | ICD-10-CM

## 2019-02-25 DIAGNOSIS — Z20828 Contact with and (suspected) exposure to other viral communicable diseases: Secondary | ICD-10-CM

## 2019-02-25 DIAGNOSIS — R0981 Nasal congestion: Secondary | ICD-10-CM

## 2019-02-25 DIAGNOSIS — J029 Acute pharyngitis, unspecified: Secondary | ICD-10-CM

## 2019-02-25 DIAGNOSIS — R6889 Other general symptoms and signs: Secondary | ICD-10-CM

## 2019-02-25 DIAGNOSIS — R197 Diarrhea, unspecified: Secondary | ICD-10-CM

## 2019-02-25 NOTE — Progress Notes (Addendum)
Virtual Visit via Video Note  I connected with Laura Robinson on 02/25/19 at  3:20 PM EDT by a video enabled telemedicine application and verified that I am speaking with the correct person using two identifiers.   I discussed the limitations of evaluation and management by telemedicine and the availability of in person appointments. The patient expressed understanding and agreed to proceed.  Pt was at home and I was in my office for the virtual visit.      Subjective:    CC:   HPI:  33 yo female started getting ST at the end of last week.  Getting bodyaches and  chills and extreme fatigue seems worse in th evening. Says her temp usually runs around 95 but running around 95.  Some loose daily.  No vomiting.  No peristant cough but occasional.  Taking nyquail.  Some headaches she has some sinus congestion no discolorated drainage.  She is also had some mid to lower chest pain above the epigastric area on and off as well.  No significant shortness of breath.  She and her husband just got a call yesterday that their son had a known exposure to another day camper.  They have broken the can present to small groups and a young lady in his group ended up testing positive.  Her son was around this child last week and the week before.  The son so far has been asymptomatic.   Past medical history, Surgical history, Family history not pertinant except as noted below, Social history, Allergies, and medications have been entered into the medical record, reviewed, and corrections made.   Review of Systems: No fevers, chills, night sweats, weight loss, chest pain, or shortness of breath.   Objective:    General: Speaking clearly in complete sentences without any shortness of breath.  Alert and oriented x3.  Normal judgment. No apparent acute distress.  Well-groomed.    Impression and Recommendations:     Suspect COVID/exposure to COVID-recommend that she get tested.  If negative and if after the  weekend still having persistent symptoms with nasal congestion fatigue and body aches then recommend potential treatment for sinusitis.  In the meantime okay to continue with symptomatic care such as her NyQuil and DayQuil..  Encouraged her to go to the emergency department if at any point she feels like she has shortness of breath or increased work of breathing.  Monitor for fever or and or new symptoms.     I discussed the assessment and treatment plan with the patient. The patient was provided an opportunity to ask questions and all were answered. The patient agreed with the plan and demonstrated an understanding of the instructions.   The patient was advised to call back or seek an in-person evaluation if the symptoms worsen or if the condition fails to improve as anticipated.   Beatrice Lecher, MD

## 2019-02-25 NOTE — Telephone Encounter (Signed)
Scheduled patient for today at 2:45 at [facility].  Testing protocol reviewed.

## 2019-02-25 NOTE — Telephone Encounter (Signed)
-----   Message from Hali Marry, MD sent at 02/25/2019 12:04 PM EDT ----- Patient with symptoms of sore throat myalgias and extreme fatigue and some chest discomfort.  Cough is only mild and some diarrhea.  Son had a known positive exposure in fact they just got a call yesterday from their sons day camp that there was a girl in his group who tested positive.  Please schedule patient for testing this afternoon if at all possible.  Her husband is also being tested as well.

## 2019-03-03 LAB — NOVEL CORONAVIRUS, NAA: SARS-CoV-2, NAA: NOT DETECTED

## 2019-04-24 ENCOUNTER — Other Ambulatory Visit: Payer: Self-pay | Admitting: Physician Assistant

## 2019-04-24 ENCOUNTER — Other Ambulatory Visit: Payer: Self-pay | Admitting: Family Medicine

## 2019-05-14 ENCOUNTER — Other Ambulatory Visit: Payer: Self-pay | Admitting: Family Medicine

## 2019-05-14 ENCOUNTER — Other Ambulatory Visit: Payer: Self-pay | Admitting: Physician Assistant

## 2019-06-01 ENCOUNTER — Telehealth: Payer: Self-pay | Admitting: Neurology

## 2019-06-01 NOTE — Telephone Encounter (Signed)
Patient wants to know if she should continue Terbinafine. She states toe nail is about 80% better, but didn't know if she needed to have labs checked prior to continuing medication. She has been taking since January. Please advise.

## 2019-06-01 NOTE — Telephone Encounter (Signed)
Tried to call back but number is busy.

## 2019-06-01 NOTE — Telephone Encounter (Signed)
6 months is the usual duration of therapy. Can she send a picture of nail. If she continues for another 3 months need labs before but if she stops do not need labs.

## 2019-06-06 NOTE — Telephone Encounter (Signed)
Tried to contact patient again with no answer, no vm.

## 2019-07-18 ENCOUNTER — Encounter: Payer: Self-pay | Admitting: Physician Assistant

## 2019-07-18 DIAGNOSIS — M109 Gout, unspecified: Secondary | ICD-10-CM | POA: Insufficient documentation

## 2019-07-18 NOTE — Assessment & Plan Note (Signed)
Possible gout, responded to colchicine and prednisone. I would like to see Laura Robinson in the office visit to discuss this further. We will likely need to check uric acid levels.

## 2019-08-30 ENCOUNTER — Telehealth: Payer: 59 | Admitting: Nurse Practitioner

## 2019-08-30 DIAGNOSIS — J029 Acute pharyngitis, unspecified: Secondary | ICD-10-CM

## 2019-08-30 DIAGNOSIS — R52 Pain, unspecified: Secondary | ICD-10-CM

## 2019-08-30 DIAGNOSIS — Z20822 Contact with and (suspected) exposure to covid-19: Secondary | ICD-10-CM

## 2019-08-30 DIAGNOSIS — R0602 Shortness of breath: Secondary | ICD-10-CM

## 2019-08-30 MED ORDER — BENZONATATE 100 MG PO CAPS: 100.0000 mg | ORAL_CAPSULE | Freq: Three times a day (TID) | ORAL | 0 refills | Status: DC | PRN

## 2019-08-30 MED ORDER — ALBUTEROL SULFATE HFA 108 (90 BASE) MCG/ACT IN AERS: 2.0000 | INHALATION_SPRAY | Freq: Four times a day (QID) | RESPIRATORY_TRACT | 0 refills | Status: DC | PRN

## 2019-08-30 NOTE — Progress Notes (Signed)
E-Visit for Corona Virus Screening   Your current symptoms could be consistent with the coronavirus.  Many health care providers can now test patients at their office but not all are.  [facility] has multiple testing sites. For information on our [place] testing locations and hours go to [email or web address]  We are enrolling you in our [place] for [place] . Daily you will receive a questionnaire within the [place] website. Our COVID 19 response team will be monitoring your responses daily.  Testing Information: The COVID-19 Community Testing sites will begin testing BY APPOINTMENT ONLY.  You can schedule online at [email or web address]  If you do not have access to a smart phone or computer you may call [phone] for an appointment.  Testing Locations: Appointment schedule is 8 am to 3:30 pm at all sites  [facility] indoors at [address], [place] [address] [facility]  indoors at [place]. [address], [place], [place] [address] [facility] indoors at [address] [place] [address]  Additional testing sites in the Community:  . For [facility] Testing sites in [facility]  FaceUpdate.uy  . For Pop-up testing sites in [place]  BowlDirectory.co.uk  . For Testing sites with regular hours [email or web address]  . For [place] [email or web address]  . For [facility] BasicJet.[email or web address]  . For [facility] testing in [place] and [employer] [email or web address]  . For [facility] testing in [facility]   https://lhi.care/covidtesting  For  more  information about community testing call [phone]   We are enrolling you in our [place] for [place] . Daily you will receive a questionnaire within the [place] website. Our COVID 19 response team will be monitoring your responses daily.  Please quarantine yourself while awaiting your test results. If you develop fever/cough/breathlessness, please stay home for 10 days with improving symptoms and until you have had 24 hours of no fever (without taking a fever reducer).  You should wear a mask or cloth face covering over your nose and mouth if you must be around other people or animals, including pets (even at home). Try to stay at least 6 feet away from other people. This will protect the people around you.  Please continue good preventive care measures, including:  frequent hand-washing, avoid touching your face, cover coughs/sneezes, stay out of crowds and keep a 6 foot distance from others.  COVID-19 is a respiratory illness with symptoms that are similar to the flu. Symptoms are typically mild to moderate, but there have been cases of severe illness and death due to the virus.   The following symptoms may appear 2-14 days after exposure: . Fever . Cough . Shortness of breath or difficulty breathing . Chills . Repeated shaking with chills . Muscle pain . Headache . Sore throat . New loss of taste or smell . Fatigue . Congestion or runny nose . Nausea or vomiting . Diarrhea  Go to the nearest hospital ED for assessment if fever/cough/breathlessness are severe or illness seems like a threat to life.  It is vitally important that if you feel that you have an infection such as this virus or any other virus that you stay home and away from places where you may spread it to others.  You should avoid contact with people age 32 and older.   You can use medication such as A prescription cough medication called Tessalon Perles 100 mg. You may take 1-2 capsules every 8 hours as  needed for cough and A prescription inhaler called Albuterol MDI 90 mcg /actuation 2 puffs every 4 hours as needed for shortness of breath, wheezing, cough  You may also take acetaminophen (Tylenol) as needed for fever.  Reduce your risk of any infection by using the same precautions used for avoiding the common cold or flu:  Marland Kitchen Wash your hands often with soap and warm water for at least 20 seconds.  If soap and water are not readily available, use an alcohol-based hand sanitizer with at least 60% alcohol.  . If coughing or sneezing, cover your mouth and nose by coughing or sneezing into the elbow areas of your shirt or coat, into a tissue or into your sleeve (not your hands). . Avoid shaking hands with others and consider head nods or verbal greetings only. . Avoid touching your eyes, nose, or mouth with unwashed hands.  . Avoid close contact with people who are sick. . Avoid places or events with large numbers of people in one location, like concerts or sporting events. . Carefully consider travel plans you have or are making. . If you are planning any travel outside or inside the [place], visit the CDC's Travelers' Health webpage for the latest health notices. . If you have some symptoms but not all symptoms, continue to monitor at home and seek medical attention if your symptoms worsen. . If you are having a medical emergency, call 911.  HOME CARE . Only take medications as instructed by your medical team. . Drink plenty of fluids and get plenty of rest. . A steam or ultrasonic humidifier can help if you have congestion.   GET HELP RIGHT AWAY IF YOU HAVE EMERGENCY WARNING SIGNS** FOR COVID-19. If you or someone is showing any of these signs seek emergency medical care immediately. Call 911 or proceed to your closest emergency facility if: . You develop worsening high fever. . Trouble breathing . Bluish lips or face . Persistent pain or pressure in the chest . New confusion . Inability to  wake or stay awake . You cough up blood. . Your symptoms become more severe  **This list is not all possible symptoms. Contact your medical provider for any symptoms that are sever or concerning to you.  MAKE SURE YOU   Understand these instructions.  Will watch your condition.  Will get help right away if you are not doing well or get worse.  Your e-visit answers were reviewed by a board certified advanced clinical practitioner to complete your personal care plan.  Depending on the condition, your plan could have included both over the counter or prescription medications.  If there is a problem please reply once you have received a response from your provider.  Your safety is important to [place].  If you have drug allergies check your prescription carefully.    You can use MyChart to ask questions about today's visit, request a non-urgent call back, or ask for a work or school excuse for 24 hours related to this e-Visit. If it has been greater than 24 hours you will need to follow up with your provider, or enter a new e-Visit to address those concerns. You will get an e-mail in the next two days asking about your experience.  I hope that your e-visit has been valuable and will speed your recovery. Thank you for using e-visits.   5-10 minutes spent reviewing and documenting in chart.

## 2019-09-26 ENCOUNTER — Ambulatory Visit (INDEPENDENT_AMBULATORY_CARE_PROVIDER_SITE_OTHER): Payer: 59 | Admitting: Physician Assistant

## 2019-09-26 ENCOUNTER — Encounter: Payer: Self-pay | Admitting: Physician Assistant

## 2019-09-26 VITALS — Temp 100.7°F | Ht 62.0 in | Wt 215.0 lb

## 2019-09-26 DIAGNOSIS — Z20822 Contact with and (suspected) exposure to covid-19: Secondary | ICD-10-CM | POA: Diagnosis not present

## 2019-09-26 NOTE — Progress Notes (Signed)
Son diagnosed with Covid diagnosed today (lives in home/ had fever and other symptoms all weekend) Sore throat/fatigue/headache/chest pain - symptoms started today Fever - started Saturday  No other symptoms  No OTC meds taken for symptoms

## 2019-09-26 NOTE — Progress Notes (Addendum)
Patient ID: Laura Robinson, female   DOB: 10-21-1985, 34 y.o.   MRN: [id number] .Marland KitchenVirtual Visit via Video Note  I connected with Skyli Engleman on 09/26/19 at  2:40 PM EST by a video enabled telemedicine application and verified that I am speaking with the correct person using two identifiers.  Location: Patient: home Provider: clinic   I discussed the limitations of evaluation and management by telemedicine and the availability of in person appointments. The patient expressed understanding and agreed to proceed.  History of Present Illness: Patient is a 34 year old obese female who calls into the clinic with Covid symptoms of fever, sore throat, nasal congestion, body aches that started on Saturday.  60 year old son tested positive for Covid today.  She denies any loss of smell or taste.  She has had a fever today.  She feels very achy.  She denies any problems breathing or shortness of breath but does have some chest pain.  She has not taken anything for her symptoms.  She does need Covid test for work.  .. Active Ambulatory Problems    Diagnosis Date Noted  . 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 Proteinuria 11/14/2014  . CKD (chronic kidney disease), stage III 11/14/2014  . Anemia associated with chronic renal failure 11/14/2014  . Secondary hyperparathyroidism, renal ([place]) 11/14/2014  . Essential hypertension 11/14/2014  . Elevated antinuclear antibody (ANA) level 11/17/2014  . Elevated C-reactive protein (CRP) 11/17/2014  . Multiple joint pain 11/17/2014  . Hematochezia 03/27/2015  . Hematuria 03/27/2015  . Acute bronchitis 08/06/2015  . Fibromyalgia 10/01/2015  . Toenail fungus 10/01/2015  . Vitamin D deficiency 10/01/2015  . Candidal intertrigo 10/03/2015  . Obese 10/03/2015  . Left foot pain 11/22/2015  . Morbid obesity ([place]) 01/01/2016  . Abnormal facial hair 01/01/2016  . Irregular periods/menstrual cycles 01/01/2016  . Acute left-sided low back pain with  left-sided sciatica 09/13/2018  . Hypertriglyceridemia 09/20/2018  . Gout 07/18/2019   Resolved Ambulatory Problems    Diagnosis Date Noted  . No Resolved Ambulatory Problems   Past Medical History:  Diagnosis Date  . Gestational diabetes mellitus in childbirth, diet controlled   . Obesity   . Pregnancy induced hypertension   . Preterm labor    Reviewed med, allergy, problem list.     Observations/Objective: No acute distress. Normal breathing. No labored breathing.  Appears weak.  Normal mood.   .. Today's Vitals   09/26/19 1328  Temp: (!) 100.7 F (38.2 C)  TempSrc: Temporal  Weight: 215 lb (97.5 kg)  Height: 5\' 2"  (1.575 m)   Body mass index is 39.32 kg/m.    Assessment and Plan: Marland KitchenMarland KitchenEvalene was seen today for sore throat.  Diagnoses and all orders for this visit:  Suspected COVID-19 virus infection -     Novel Coronavirus, NAA ([facility])  Close exposure to COVID-19 virus -     Novel Coronavirus, NAA ([facility])   Suspected Covid with positive test for son today.  She does need actual Covid test for her work.  Will come through our drive-through and get tested today.  Discussed symptoms of covid.  Discussed warning signs and things to look out for.  Symptomatic care with vitamin C, zinc, ibuprofen encouraged.  Last set of rest and hydration.  Please quarantine 10 days from symptoms which would be February 3 that she would be able to go back into public.  Please call with any worsening breathing issues.  If any sudden symptoms go to emergency room.  Spent 11  minutes in patient care.   Follow Up Instructions:    I discussed the assessment and treatment plan with the patient. The patient was provided an opportunity to ask questions and all were answered. The patient agreed with the plan and demonstrated an understanding of the instructions.   The patient was advised to call back or seek an in-person evaluation if the symptoms worsen or if the condition fails to  improve as anticipated.    Iran Planas, PA-C

## 2019-09-28 LAB — SPECIMEN STATUS REPORT

## 2019-09-28 LAB — NOVEL CORONAVIRUS, NAA: SARS-CoV-2, NAA: NOT DETECTED

## 2019-09-28 NOTE — Progress Notes (Signed)
Negative for covid

## 2019-10-07 ENCOUNTER — Other Ambulatory Visit: Payer: Self-pay | Admitting: Family Medicine

## 2019-11-15 ENCOUNTER — Ambulatory Visit (INDEPENDENT_AMBULATORY_CARE_PROVIDER_SITE_OTHER): Payer: 59 | Admitting: Physician Assistant

## 2019-11-15 ENCOUNTER — Other Ambulatory Visit: Payer: Self-pay

## 2019-11-15 VITALS — BP 104/55 | HR 70 | Ht 62.0 in | Wt 221.0 lb

## 2019-11-15 DIAGNOSIS — R1902 Left upper quadrant abdominal swelling, mass and lump: Secondary | ICD-10-CM

## 2019-11-15 DIAGNOSIS — R1901 Right upper quadrant abdominal swelling, mass and lump: Secondary | ICD-10-CM | POA: Diagnosis not present

## 2019-11-15 NOTE — Patient Instructions (Addendum)
Will order CT.   Lipoma  A lipoma is a noncancerous (benign) tumor that is made up of fat cells. This is a very common type of soft-tissue growth. Lipomas are usually found under the skin (subcutaneous). They may occur in any tissue of the body that contains fat. Common areas for lipomas to appear include the back, arms, shoulders, buttocks, and thighs. Lipomas grow slowly, and they are usually painless. Most lipomas do not cause problems and do not require treatment. What are the causes? The cause of this condition is not known. What increases the risk? You are more likely to develop this condition if:  You are 2-102 years old.  You have a family history of lipomas. What are the signs or symptoms? A lipoma usually appears as a small, round bump under the skin. In most cases, the lump will:  Feel soft or rubbery.  Not cause pain or other symptoms. However, if a lipoma is located in an area where it pushes on nerves, it can become painful or cause other symptoms. How is this diagnosed? A lipoma can usually be diagnosed with a physical exam. You may also have tests to confirm the diagnosis and to rule out other conditions. Tests may include:  Imaging tests, such as a CT scan or an MRI.  Removal of a tissue sample to be looked at under a microscope (biopsy). How is this treated? Treatment for this condition depends on the size of the lipoma and whether it is causing any symptoms.  For small lipomas that are not causing problems, no treatment is needed.  If a lipoma is bigger or it causes problems, surgery may be done to remove the lipoma. Lipomas can also be removed to improve appearance. Most often, the procedure is done after applying a medicine that numbs the area (local anesthetic).  Liposuction may be done to reduce the size of the lipoma before it is removed through surgery, or it may be done to remove the lipoma. Lipomas are removed with this method in order to limit incision  size and scarring. A liposuction tube is inserted through a small incision into the lipoma, and the contents of the lipoma are removed through the tube with suction. Follow these instructions at home:  Watch your lipoma for any changes.  Keep all follow-up visits as told by your health care provider. This is important. Contact a health care provider if:  Your lipoma becomes larger or hard.  Your lipoma becomes painful, red, or increasingly swollen. These could be signs of infection or a more serious condition. Get help right away if:  You develop tingling or numbness in an area near the lipoma. This could indicate that the lipoma is causing nerve damage. Summary  A lipoma is a noncancerous tumor that is made up of fat cells.  Most lipomas do not cause problems and do not require treatment.  If a lipoma is bigger or it causes problems, surgery may be done to remove the lipoma.  Contact a health care provider if your lipoma becomes larger or hard, or if it becomes painful, red, or increasingly swollen. Pain, redness, and swelling could be signs of infection or a more serious condition. This information is not intended to replace advice given to you by your health care provider. Make sure you discuss any questions you have with your health care provider. Document Revised: 04/04/2019 Document Reviewed: 04/04/2019 Elsevier Patient Education  [place].

## 2019-11-15 NOTE — Progress Notes (Signed)
   Subjective:    Patient ID: Laura Robinson, female    DOB: 1985-10-29, 34 y.o.   MRN: [id number]  HPI  Pt is a 34 yo obese female who presents to the clinic with abdominal mass in her left upper quadrant and right upper quadrant. She has noticed for the last few months but do seem to be getting bigger and more painful. Her husband wanted her to get checked out.   .. Active Ambulatory Problems    Diagnosis Date Noted  . 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 Proteinuria 11/14/2014  . CKD (chronic kidney disease), stage III 11/14/2014  . Anemia associated with chronic renal failure 11/14/2014  . Secondary hyperparathyroidism, renal ([place]) 11/14/2014  . Essential hypertension 11/14/2014  . Elevated antinuclear antibody (ANA) level 11/17/2014  . Elevated C-reactive protein (CRP) 11/17/2014  . Multiple joint pain 11/17/2014  . Hematochezia 03/27/2015  . Hematuria 03/27/2015  . Acute bronchitis 08/06/2015  . Fibromyalgia 10/01/2015  . Toenail fungus 10/01/2015  . Vitamin D deficiency 10/01/2015  . Candidal intertrigo 10/03/2015  . Obese 10/03/2015  . Left foot pain 11/22/2015  . Morbid obesity ([place]) 01/01/2016  . Abnormal facial hair 01/01/2016  . Irregular periods/menstrual cycles 01/01/2016  . Acute left-sided low back pain with left-sided sciatica 09/13/2018  . Hypertriglyceridemia 09/20/2018  . Gout 07/18/2019  . Abdominal mass, left upper quadrant 11/16/2019  . Abdominal mass, right upper quadrant 11/16/2019   Resolved Ambulatory Problems    Diagnosis Date Noted  . No Resolved Ambulatory Problems   Past Medical History:  Diagnosis Date  . Gestational diabetes mellitus in childbirth, diet controlled   . Obesity   . Pregnancy induced hypertension   . Preterm labor        Review of Systems See HPI.     Objective:   Physical Exam Constitutional:      Appearance: Normal appearance. She is obese.  Cardiovascular:     Rate and Rhythm: Normal rate and  regular rhythm.     Pulses: Normal pulses.  Pulmonary:     Effort: Pulmonary effort is normal.  Abdominal:     General: There is no distension.     Palpations: Abdomen is soft.     Tenderness: There is abdominal tenderness. There is no guarding.     Comments: Tenderness over the 2 palpable 1cm by 2cm firm masses  LUQ. About 1cm by 3cm in RUQ.  Neurological:     Mental Status: She is oriented to person, place, and time.  Psychiatric:        Mood and Affect: Mood normal.           Assessment & Plan:  Marland KitchenMarland KitchenLakendra was seen today for mass.  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 Encouraged weight loss to help with these. Will get ultrasound first to better characterize. If they become increasingly painful general surgery will at times consider removal. HO given.

## 2019-11-16 ENCOUNTER — Encounter: Payer: Self-pay | Admitting: Physician Assistant

## 2019-11-16 DIAGNOSIS — R1902 Left upper quadrant abdominal swelling, mass and lump: Secondary | ICD-10-CM | POA: Insufficient documentation

## 2019-11-16 DIAGNOSIS — R1901 Right upper quadrant abdominal swelling, mass and lump: Secondary | ICD-10-CM | POA: Insufficient documentation

## 2019-11-24 ENCOUNTER — Ambulatory Visit (INDEPENDENT_AMBULATORY_CARE_PROVIDER_SITE_OTHER): Payer: 59

## 2019-11-24 ENCOUNTER — Other Ambulatory Visit: Payer: Self-pay

## 2019-11-24 DIAGNOSIS — R1902 Left upper quadrant abdominal swelling, mass and lump: Secondary | ICD-10-CM

## 2019-11-24 DIAGNOSIS — R1901 Right upper quadrant abdominal swelling, mass and lump: Secondary | ICD-10-CM

## 2019-11-25 NOTE — Progress Notes (Signed)
Mennie,   U/S did not detect any abnormal lesions or growths. I would not be concerned if you feel them growing or becoming more painful we could consider MRI.

## 2019-12-28 ENCOUNTER — Telehealth (INDEPENDENT_AMBULATORY_CARE_PROVIDER_SITE_OTHER): Payer: 59 | Admitting: Family Medicine

## 2019-12-28 ENCOUNTER — Encounter: Payer: Self-pay | Admitting: Family Medicine

## 2019-12-28 VITALS — Temp 97.3°F | Ht 62.0 in | Wt 216.0 lb

## 2019-12-28 DIAGNOSIS — J069 Acute upper respiratory infection, unspecified: Secondary | ICD-10-CM

## 2019-12-28 NOTE — Progress Notes (Signed)
Virtual Visit via Video Note  I connected with Laura Robinson on 12/28/19 at 10:10 AM EDT by a video enabled telemedicine application and verified that I am speaking with the correct person using two identifiers.   I discussed the limitations of evaluation and management by telemedicine and the availability of in person appointments. The patient expressed understanding and agreed to proceed.  Subjective:    CC: Sick   HPI:  34 year old female with a history of focal segmental glomerular sclerosis and stage III kidney disease complains of upper respiratory symptoms including sore throat and sinus drainage and ear fullness for about 3 days.  She denies any cough.  She denies any fevers or chills or sweats.  No GI symptoms.  No body aches or headaches.  She said she is had a little bit of mild congestion but nothing significant or severe.  She has noticed a few swollen lymph nodes in her neck.  She had the [employer] vaccine for [employer] on April 8.  She denies any known exposures to Covid-19.  Because of her kidney disease she wanted some advice on what would be appropriate to take.  She does sometimes have some seasonal allergies but says it is never been quite this severe.    Past medical history, Surgical history, Family history not pertinant except as noted below, Social history, Allergies, and medications have been entered into the medical record, reviewed, and corrections made.   Review of Systems: No fevers, chills, night sweats, weight loss, chest pain, or shortness of breath.   Objective:    General: Speaking clearly in complete sentences without any shortness of breath.  Alert and oriented x3.  Normal judgment. No apparent acute distress.    Impression and Recommendations:    No problem-specific Assessment & Plan notes found for this encounter.  Upper respiratory infection-likely viral.  We did discuss the possibility that she could still have Covid even though she was  vaccinated and to consider getting tested.  We also discussed okay to use Flonase and/or Nasonex since it works more topically.  And could also cover for allergies if that could be a component of what is going on with her versus virus.  Also recommended salt water gargles, herbal with honey, nasal saline spray and possibly humidifier.  If she is not better after the weekend then please let us know or if she develops new symptoms such as cough and chest congestion or significant sinus congestion pain and pressure then to let us know.    Time spent in encounter 20 minutes  I discussed the assessment and treatment plan with the patient. The patient was provided an opportunity to ask questions and all were answered. The patient agreed with the plan and demonstrated an understanding of the instructions.   The patient was advised to call back or seek an in-person evaluation if the symptoms worsen or if the condition fails to improve as anticipated.   Beatrice Lecher, MD

## 2019-12-28 NOTE — Progress Notes (Signed)
Pt reports that she began experiencing sxs 2 days ago. Denies any f/s/c/n/v/d/headaches/bodyaches.  She c/o head pressure,and on Tuesday she began experiencing a sore throat w/some drainage and continued head pressure along with ear pressure.   She hasn't taken any OTC medications due to her kidney issues and not knowing what would be safe for her to take.   She did have her COVID vaccine done on 12/08/2019 it was the [employer].

## 2019-12-29 ENCOUNTER — Telehealth: Payer: Self-pay

## 2019-12-29 NOTE — Telephone Encounter (Signed)
OK , if started round same time as her sxs it can caused by a virus. If recurs we are happy to see her

## 2019-12-29 NOTE — Telephone Encounter (Signed)
Laura Robinson states she didn't mention has also been having a rash on her thigh and chest that comes and goes. It does itch and burns. She doesn't have the rash at the moment.

## 2019-12-30 NOTE — Telephone Encounter (Signed)
Patient advised.

## 2020-04-07 ENCOUNTER — Telehealth: Payer: 59 | Admitting: Nurse Practitioner

## 2020-04-07 DIAGNOSIS — M545 Low back pain, unspecified: Secondary | ICD-10-CM

## 2020-04-07 DIAGNOSIS — R399 Unspecified symptoms and signs involving the genitourinary system: Secondary | ICD-10-CM

## 2020-04-07 NOTE — Progress Notes (Signed)
Based on what you shared with me it looks like you have urinary symptoms with back pain,that should be evaluated in a face to face office visit. Due to the back pain you need to have a face to face visit with urinalysis and urine culture.    NOTE: If you entered your credit card information for this eVisit, you will not be charged. You may see a "hold" on your card for the $35 but that hold will drop off and you will not have a charge processed.  If you are having a true medical emergency please call 911.     For an urgent face to face visit, [facility] has four urgent care centers for your convenience:   . [facility]    [phone]                  Get Driving Directions  [address], [place] [address] . 10 am to 8 pm Monday-Friday . 12 pm to 8 pm Saturday-Sunday   . [facility] at [place]                  Get Driving Directions  [address], [place], [place] [address] . 8 am to 8 pm Monday-Friday . 9 am to 6 pm Saturday . 11 am to 6 pm Sunday   . [facility] at [place]                  Get Driving Directions   [address], [place] [address] . 8 am to 8 pm Monday-Friday . 8 am to 4 pm Saturday-Sunday    . [facility] at [place]                    Get Driving Directions  [phone]  [address] [place], [place] [address]  . Monday-Friday, 12 PM to 6 PM    Your e-visit answers were reviewed by a board certified advanced clinical practitioner to complete your personal care plan.  Thank you for using e-Visits.

## 2020-04-08 ENCOUNTER — Other Ambulatory Visit: Payer: Self-pay

## 2020-04-08 ENCOUNTER — Emergency Department
Admission: RE | Admit: 2020-04-08 | Discharge: 2020-04-08 | Disposition: A | Discharge status: Home / Self Care | Payer: 59 | Source: Ambulatory Visit

## 2020-04-08 VITALS — BP 118/72 | HR 106 | Temp 99.0°F

## 2020-04-08 DIAGNOSIS — R3915 Urgency of urination: Secondary | ICD-10-CM

## 2020-04-08 DIAGNOSIS — N3001 Acute cystitis with hematuria: Secondary | ICD-10-CM

## 2020-04-08 DIAGNOSIS — R509 Fever, unspecified: Secondary | ICD-10-CM

## 2020-04-08 DIAGNOSIS — N1831 Chronic kidney disease, stage 3a: Secondary | ICD-10-CM

## 2020-04-08 DIAGNOSIS — R109 Unspecified abdominal pain: Secondary | ICD-10-CM

## 2020-04-08 LAB — POCT URINALYSIS DIP (MANUAL ENTRY)
Bilirubin, UA: NEGATIVE
Glucose, UA: NEGATIVE mg/dL
Ketones, POC UA: NEGATIVE mg/dL
Nitrite, UA: NEGATIVE
Protein Ur, POC: 100 mg/dL — AB
Spec Grav, UA: 1.01 (ref 1.010–1.025)
Urobilinogen, UA: 0.2 E.U./dL
pH, UA: 5.5 (ref 5.0–8.0)

## 2020-04-08 LAB — POCT CBC W AUTO DIFF (K'VILLE URGENT CARE)

## 2020-04-08 MED ORDER — CEPHALEXIN 500 MG PO CAPS: 500.0000 mg | ORAL_CAPSULE | Freq: Two times a day (BID) | ORAL | 0 refills | Status: AC

## 2020-04-08 NOTE — ED Provider Notes (Signed)
[facility]    CSN: [id number] Arrival date & time: 04/08/20  1057      History   Chief Complaint Chief Complaint  Patient presents with  . Appointment  . Urinary Tract Infection    HPI Laura Robinson is a 34 y.o. female.   HPI Laura Robinson is a 34 y.o. female presenting to UC with hx of CKD stage III, c/o 1 week gradually worsening dysuria, urgency, odorous urine, chills, nausea, Left flank pain no relief with increased water intake or OTC Azo.  Denies vomiting or diarrhea. No vaginal symptoms. She has not f/u with PCP or nephrology in over 1 year.     Past Medical History:  Diagnosis Date  . CKD (chronic kidney disease), stage III 11/14/2014  . FSGS (focal segmental glomerulosclerosis) 11/14/2014   Dr. Posey Pronto, [facility]  With glemerulonephritis.    . Gestational diabetes mellitus in childbirth, diet controlled   . Obesity   . Pregnancy induced hypertension   . Preterm labor     Patient Active Problem List   Diagnosis Date Noted  . Abdominal mass, left upper quadrant 11/16/2019  . Abdominal mass, right upper quadrant 11/16/2019  . Gout 07/18/2019  . Hypertriglyceridemia 09/20/2018  . Acute left-sided low back pain with left-sided sciatica 09/13/2018  . Morbid obesity ([place]) 01/01/2016  . Abnormal facial hair 01/01/2016  . Irregular periods/menstrual cycles 01/01/2016  . Left foot pain 11/22/2015  . Candidal intertrigo 10/03/2015  . Obese 10/03/2015  . Fibromyalgia 10/01/2015  . Toenail fungus 10/01/2015  . Vitamin D deficiency 10/01/2015  . Acute bronchitis 08/06/2015  . Hematochezia 03/27/2015  . Hematuria 03/27/2015  . Elevated antinuclear antibody (ANA) level 11/17/2014  . Elevated C-reactive protein (CRP) 11/17/2014  . Multiple joint pain 11/17/2014  . 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 Proteinuria 11/14/2014  . CKD (chronic kidney disease), stage III 11/14/2014  . Anemia associated with chronic renal failure 11/14/2014    . Secondary hyperparathyroidism, renal ([place]) 11/14/2014  . Essential hypertension 11/14/2014    Past Surgical History:  Procedure Laterality Date  . NO PAST SURGERIES      OB History    Gravida  3   Para  1   Term      Preterm  1   AB  1   Living  1     SAB      TAB  1   Ectopic      Multiple      Live Births  1            Home Medications    Prior to Admission medications   Medication Sig Start Date End Date Taking? Authorizing Provider  allopurinol (ZYLOPRIM) 100 MG tablet Take 1 tablet (100 mg total) by mouth daily. For gout prevention 10/07/19  Yes Breeback, Jade L, PA-C  Ascorbic Acid (VITAMIN C) 100 MG tablet Take 100 mg by mouth daily.   Yes [provider]  Cholecalciferol (VITAMIN D-3 PO) Take by mouth daily.   Yes [provider]  diphenhydrAMINE (BENADRYL) 25 MG tablet Take 25 mg by mouth every 6 (six) hours as needed.   Yes [provider]  telmisartan (MICARDIS) 80 MG tablet Take 80 mg by mouth daily. 12/19/19  Yes [provider]  cephALEXin (KEFLEX) 500 MG capsule Take 1 capsule (500 mg total) by mouth 2 (two) times daily. 04/08/20   Noe Gens, PA-C    Family History Family History  Problem Relation Age of  Onset  . Asthma Brother   . Hypertension Brother   . Kidney disease Brother   . Cancer Maternal Grandmother   . Hypertension Maternal Grandmother   . Diabetes Father   . Hypertension Father   . Hypertension Maternal Grandfather   . Hypertension Paternal Grandmother   . Alcohol abuse Paternal Grandfather   . Hypertension Paternal Grandfather   . Anesthesia problems Neg Hx     Social History Social History   Tobacco Use  . Smoking status: Former Smoker    Packs/day: 0.25    Years: 4.00    Pack years: 1.00    Types: Cigarettes    Quit date: 07/14/2011    Years since quitting: 8.7  . Smokeless tobacco: Never Used  Substance Use Topics  . Alcohol use: No    Comment: prior to pregnancy   . Drug use: No     Allergies   Ibuprofen and Phentermine   Review of Systems Review of Systems  Constitutional: Positive for chills. Negative for fever.  HENT: Negative for congestion, rhinorrhea and sore throat.   Respiratory: Negative for cough and shortness of breath.   Gastrointestinal: Positive for nausea. Negative for abdominal pain, constipation, diarrhea and vomiting.  Genitourinary: Positive for dysuria, flank pain, frequency and urgency. Negative for hematuria, vaginal bleeding, vaginal discharge and vaginal pain.  Neurological: Negative for dizziness and headaches.     Physical Exam Triage Vital Signs ED Triage Vitals  Enc Vitals Group     BP 04/08/20 1137 118/72     Pulse Rate 04/08/20 1137 (!) 106     Resp --      Temp 04/08/20 1137 99 F (37.2 C)     Temp Source 04/08/20 1137 Oral     SpO2 04/08/20 1137 98 %     Weight --      Height --      Head Circumference --      Peak Flow --      Pain Score 04/08/20 1139 6     Pain Loc --      Pain Edu? --      Excl. in [place]? --    No data found.  Updated Vital Signs BP 118/72 (BP Location: Left Arm)   Pulse (!) 106   Temp 99 F (37.2 C) (Oral)   LMP 04/01/2020   SpO2 98%   Visual Acuity Right Eye Distance:   Left Eye Distance:   Bilateral Distance:    Right Eye Near:   Left Eye Near:    Bilateral Near:     Physical Exam Vitals and nursing note reviewed.  Constitutional:      General: She is not in acute distress.    Appearance: Normal appearance. She is well-developed. She is not ill-appearing, toxic-appearing or diaphoretic.  HENT:     Head: Normocephalic and atraumatic.  Cardiovascular:     Rate and Rhythm: Normal rate and regular rhythm.  Pulmonary:     Effort: Pulmonary effort is normal. No respiratory distress.     Breath sounds: Normal breath sounds. No stridor. No wheezing, rhonchi or rales.  Abdominal:     General: There is no distension.     Palpations: Abdomen is soft. There is no  mass.     Tenderness: There is no abdominal tenderness. There is left CVA tenderness. There is no right CVA tenderness.  Musculoskeletal:        General: Normal range of motion.     Cervical back: Normal range  of motion.  Skin:    General: Skin is warm and dry.  Neurological:     Mental Status: She is alert and oriented to person, place, and time.  Psychiatric:        Behavior: Behavior normal.      UC Treatments / Results  Labs (all labs ordered are listed, but only abnormal results are displayed) Labs Reviewed  POCT URINALYSIS DIP (MANUAL ENTRY) - Abnormal; Notable for the following components:      Result Value   Blood, UA moderate (*)    Protein Ur, POC =100 (*)    Leukocytes, UA Small (1+) (*)    All other components within normal limits  URINE CULTURE  COMPLETE METABOLIC PANEL WITH GFR  POCT CBC W AUTO DIFF ([facility])    EKG   Radiology No results found.  Procedures Procedures (including critical care time)  Medications Ordered in UC Medications - No data to display  Initial Impression / Assessment and Plan / UC Course  I have reviewed the triage vital signs and the nursing notes.  Pertinent labs & imaging results that were available during my care of the patient were reviewed by me and considered in my medical decision making (see chart for details).    UA c/w UTI Culture sent WBC: 15.6 Given hx of CKD stage III, patients young age, chills, CVAT and elevated WBC, recommend further evaluation and treatment in emergency department this afternoon to help reduce risk of further kidney injury as CMP would not be back until later tomorrow.  Pt understanding and agreeable with plan to drive herself to [facility] this afternoon. AVS given   Final Clinical Impressions(s) / UC Diagnoses   Final diagnoses:  Urinary urgency  Left flank pain  Fever in adult  Stage 3a chronic kidney disease  Acute cystitis with hematuria     Discharge  Instructions      Because your white blood cell count is elevated 15.6, your worsening symptoms of fever, back pain and nausea on top of your history of chronic kidney disease, it is highly recommended you are evaluated further and treated in the emergency department this afternoon. The labs that are needed to check your kidney function at our clinic will not be back until later today. Prompt treatment today with possible IV fluid sand IV antibiotics will help limit any additional damage to your kidneys.     ED Prescriptions    Medication Sig Dispense Auth. Provider   cephALEXin (KEFLEX) 500 MG capsule Take 1 capsule (500 mg total) by mouth 2 (two) times daily. 14 capsule Noe Gens, [place]     PDMP not reviewed this encounter.   Noe Gens, PA-C 04/08/20 1245

## 2020-04-08 NOTE — Discharge Instructions (Addendum)
  Because your white blood cell count is elevated 15.6, your worsening symptoms of fever, back pain and nausea on top of your history of chronic kidney disease, it is highly recommended you are evaluated further and treated in the emergency department this afternoon. The labs that are needed to check your kidney function at our clinic will not be back until later today. Prompt treatment today with possible IV fluid sand IV antibiotics will help limit any additional damage to your kidneys.

## 2020-04-08 NOTE — ED Triage Notes (Signed)
Patient is c/o dysuria, odorus urine x 1 week, took OTC AZO, increased water intake, chills, febrile, nausea, low back pain, urgency.  Patient has been vaccinated.

## 2020-04-09 ENCOUNTER — Telehealth (INDEPENDENT_AMBULATORY_CARE_PROVIDER_SITE_OTHER): Payer: 59 | Admitting: Neurology

## 2020-04-09 DIAGNOSIS — R21 Rash and other nonspecific skin eruption: Secondary | ICD-10-CM | POA: Diagnosis not present

## 2020-04-09 NOTE — Telephone Encounter (Signed)
Patient's husband left vm stating patient in hospital with UTI yesterday. Received IV Fluids/antibiotics. Sent home on oral antibiotics. Still having fever. Also complaining of severe headaches. They want to know when patient should follow up here. Please advise. Patient - [phone], husband - [phone].

## 2020-04-09 NOTE — Telephone Encounter (Signed)
Hospital follow-up should be in about 7 days.   If she is having other symptoms, however, she may need to be seen for these sooner.

## 2020-04-10 ENCOUNTER — Encounter: Payer: Self-pay | Admitting: Physician Assistant

## 2020-04-10 ENCOUNTER — Telehealth: Payer: Self-pay

## 2020-04-10 NOTE — Telephone Encounter (Signed)
See if she can send a MyChart message with the picture of the rash, as long she has no mucosal symptoms, no wheezing, shortness of breath, lip swelling, I think we can continue the antibiotic for now.  Has she ever had a problem with cephalosporins in the past or with penicillin?

## 2020-04-10 NOTE — Telephone Encounter (Signed)
Laura Robinson was in the ED and treated for a UTI. She is feeling better and the fever broke. Now she has a rash on her right arm and wrist. She wanted to know if she should stop the antibiotic. Also, should she take Benadryl? We have no openings until Thursday.

## 2020-04-10 NOTE — Telephone Encounter (Signed)
She reports no other symptoms. She will send a MyChart message with a picture.

## 2020-04-10 NOTE — Telephone Encounter (Signed)
Spoke with patient and she feels better today. Fever finally gone. She has appt scheduled in two days. Will call back if symptoms worsen. Will continue medications.

## 2020-04-11 LAB — URINE CULTURE
MICRO NUMBER:: 10801952
SPECIMEN QUALITY:: ADEQUATE

## 2020-04-12 ENCOUNTER — Ambulatory Visit (INDEPENDENT_AMBULATORY_CARE_PROVIDER_SITE_OTHER): Payer: 59 | Admitting: Nurse Practitioner

## 2020-04-12 ENCOUNTER — Encounter: Payer: Self-pay | Admitting: Nurse Practitioner

## 2020-04-12 ENCOUNTER — Other Ambulatory Visit: Payer: Self-pay

## 2020-04-12 VITALS — BP 111/78 | HR 94 | Temp 97.9°F | Ht 62.0 in | Wt 220.0 lb

## 2020-04-12 DIAGNOSIS — Z09 Encounter for follow-up examination after completed treatment for conditions other than malignant neoplasm: Secondary | ICD-10-CM

## 2020-04-12 NOTE — Progress Notes (Signed)
Established Patient Office Visit  Subjective:  Patient ID: Laura Robinson, female    DOB: 1986-08-01  Age: 34 y.o. MRN: [id number]  CC: No chief complaint on file.   HPI Laura Robinson is a pleasant 34 year old female here for follow-up from emergency room visit on 04/08/2020 with diagnosis of urinary tract infection with hematuria. She was treated with IV rocephin and discharged with oral Keflex. Urine culture revealed the presence of E. Coli.   Urinary symptoms have subsided, but she did have a severe headache that resolved yesterday and today she feels "out of it" and fatigued.   She reports her symptoms were quite severe when she presented to the emergency room with a fever of nearly 103, severe back pain on the left side, and chills. She reports that she had never experienced anything like this. She states the emergency room doctor told her the CT was negative and that she had a bad bladder infection.     Past Medical History:  Diagnosis Date   CKD (chronic kidney disease), stage III 11/14/2014   FSGS (focal segmental glomerulosclerosis) 11/14/2014   Dr. Posey Pronto, [facility]  With glemerulonephritis.     Gestational diabetes mellitus in childbirth, diet controlled    Obesity    Pregnancy induced hypertension    Preterm labor     Past Surgical History:  Procedure Laterality Date   NO PAST SURGERIES      Family History  Problem Relation Age of Onset   Asthma Brother    Hypertension Brother    Kidney disease Brother    Cancer Maternal Grandmother    Hypertension Maternal Grandmother    Diabetes Father    Hypertension Father    Hypertension Maternal Grandfather    Hypertension Paternal Grandmother    Alcohol abuse Paternal Grandfather    Hypertension Paternal Grandfather    Anesthesia problems Neg Hx     Social History   Socioeconomic History   Marital status: Married    Spouse name: Not on file   Number of children: Not on file   Years of  education: Not on file   Highest education level: Not on file  Occupational History   Not on file  Tobacco Use   Smoking status: Former Smoker    Packs/day: 0.25    Years: 4.00    Pack years: 1.00    Types: Cigarettes    Quit date: 07/14/2011    Years since quitting: 8.7   Smokeless tobacco: Never Used  Substance and Sexual Activity   Alcohol use: No    Comment: prior to pregnancy   Drug use: No   Sexual activity: Yes  Other Topics Concern   Not on file  Social History Narrative   Not on file   Social Determinants of Health   Financial Resource Strain:    Difficulty of Paying Living Expenses:   Food Insecurity:    Worried About Charity fundraiser in the Last Year:    Arboriculturist in the Last Year:   Transportation Needs:    Film/video editor (Medical):    Lack of Transportation (Non-Medical):   Physical Activity:    Days of Exercise per Week:    Minutes of Exercise per Session:   Stress:    Feeling of Stress :   Social Connections:    Frequency of Communication with Friends and Family:    Frequency of Social Gatherings with Friends and Family:    Attends Religious Services:  Active Member of Clubs or Organizations:    Attends Music therapist:    Marital Status:   Intimate Partner Violence:    Fear of Current or Ex-Partner:    Emotionally Abused:    Physically Abused:    Sexually Abused:     Outpatient Medications Prior to Visit  Medication Sig Dispense Refill   allopurinol (ZYLOPRIM) 100 MG tablet Take 1 tablet (100 mg total) by mouth daily. For gout prevention 90 tablet 3   Ascorbic Acid (VITAMIN C) 100 MG tablet Take 100 mg by mouth daily.     cephALEXin (KEFLEX) 500 MG capsule Take 1 capsule (500 mg total) by mouth 2 (two) times daily. 14 capsule 0   Cholecalciferol (VITAMIN D-3 PO) Take by mouth daily.     diphenhydrAMINE (BENADRYL) 25 MG tablet Take 25 mg by mouth every 6 (six) hours as needed.      telmisartan (MICARDIS) 80 MG tablet Take 80 mg by mouth daily.     No facility-administered medications prior to visit.    Allergies  Allergen Reactions   Ibuprofen     Not to take due to kidney disease.     Phentermine     Gout flare.      Objective:    Physical Exam Vitals and nursing note reviewed.  Constitutional:      Appearance: Normal appearance.  HENT:     Head: Normocephalic.  Eyes:     Extraocular Movements: Extraocular movements intact.     Conjunctiva/sclera: Conjunctivae normal.     Pupils: Pupils are equal, round, and reactive to light.  Cardiovascular:     Rate and Rhythm: Normal rate.     Pulses: Normal pulses.     Heart sounds: Normal heart sounds.  Pulmonary:     Effort: Pulmonary effort is normal.     Breath sounds: Normal breath sounds.  Abdominal:     General: Abdomen is flat. There is no distension.     Palpations: Abdomen is soft.     Tenderness: There is no abdominal tenderness. There is no right CVA tenderness, left CVA tenderness or guarding.  Musculoskeletal:        General: Normal range of motion.     Cervical back: Normal range of motion and neck supple.     Right lower leg: No edema.     Left lower leg: No edema.  Skin:    General: Skin is warm and dry.     Capillary Refill: Capillary refill takes less than 2 seconds.  Neurological:     General: No focal deficit present.     Mental Status: She is alert and oriented to person, place, and time.  Psychiatric:        Mood and Affect: Mood normal.        Behavior: Behavior normal.        Thought Content: Thought content normal.        Judgment: Judgment normal.     LMP 04/01/2020  Wt Readings from Last 3 Encounters:  12/28/19 216 lb (98 kg)  11/15/19 221 lb (100.2 kg)  09/26/19 215 lb (97.5 kg)     Health Maintenance Due  Topic Date Due   PAP SMEAR-Modifier  01/17/2020   INFLUENZA VACCINE  04/01/2020    There are no preventive care reminders to display for this  patient.  Lab Results  Component Value Date   TSH 3.80 09/16/2018   Lab Results  Component Value Date   WBC 7.5  09/30/2018   HGB 13.7 09/30/2018   HCT 41.1 09/30/2018   MCV 85.4 09/30/2018   PLT 297 09/30/2018   Lab Results  Component Value Date   NA 142 09/30/2018   K 4.7 09/30/2018   CO2 22 09/30/2018   GLUCOSE 97 09/30/2018   BUN 20 09/30/2018   CREATININE 1.68 (H) 09/30/2018   BILITOT 0.3 09/16/2018   ALKPHOS 81 12/13/2015   AST 13 09/16/2018   ALT 22 09/16/2018   PROT 6.6 09/16/2018   ALBUMIN 4.4 12/13/2015   CALCIUM 9.7 09/30/2018   Lab Results  Component Value Date   CHOL 186 09/16/2018   Lab Results  Component Value Date   HDL 39 (L) 09/16/2018   Lab Results  Component Value Date   LDLCALC 113 (H) 09/16/2018   Lab Results  Component Value Date   TRIG 215 (H) 09/16/2018   Lab Results  Component Value Date   CHOLHDL 4.8 09/16/2018   Lab Results  Component Value Date   HGBA1C 5.5 11/14/2014      Assessment & Plan:   1. Hospital discharge follow-up Patient has made significant improvement since her discharge. Her symptoms were consistent with pyelonephritis given the severity of symptoms and systemic symptoms that were present.  Recommend continue increased hydration and rest.  Finish current antibiotic regimen.  Return to the clinic if symptoms get worse or if she does not completely improve.  We will need to recheck labs in about 3 months to look at her kidney function to make sure she has returned to baseline.   Follow-up 3 months for labs  Orma Render, NP

## 2020-04-12 NOTE — Patient Instructions (Signed)
Pyelonephritis, Adult °Pyelonephritis is an infection that occurs in the kidney. The kidneys are the organs that filter a person's blood and move waste out of the bloodstream and into the urine. Urine passes from the kidneys, through tubes called ureters, and into the bladder. There are two main types of pyelonephritis: °· Infections that come on quickly without any warning (acute pyelonephritis). °· Infections that last for a long period of time (chronic pyelonephritis). °In most cases, the infection clears up with treatment and does not cause further problems. More severe infections or chronic infections can sometimes spread to the bloodstream or lead to other problems with the kidneys. °What are the causes? °This condition is usually caused by: °· Bacteria traveling from the bladder up to the kidney. This may occur after having a bladder infection (cystitis) or urinary tract infection (UTI). °· Bladder infections caused from bacteria traveling from the bloodstream to the kidney. °What increases the risk? °This condition is more likely to develop in: °· Pregnant women. °· Older people. °· People who have any of these conditions: °? Diabetes. °? Inflammation of the prostate gland (prostatitis), in males. °? Kidney stones or bladder stones. °? Other abnormalities of the kidney or ureter. °? Cancer. °· People who have a catheter placed in the bladder. °· People who are sexually active. °· Women who use spermicides. °· People who have had a prior UTI. °What are the signs or symptoms? °Symptoms of this condition include: °· Frequent urination. °· Strong or persistent urge to urinate. °· Burning or stinging when urinating. °· Abdominal pain. °· Back pain. °· Pain in the side or flank area. °· Fever or chills. °· Blood in the urine, or dark urine. °· Nausea or vomiting. °How is this diagnosed? °This condition may be diagnosed based on: °· Your medical history and a physical exam. °· Urine tests. °· Blood tests. °You may  also have imaging tests of the kidneys, such as an ultrasound or CT scan. °How is this treated? °Treatment for this condition may depend on the severity of the infection. °· If the infection is mild and is found Shakti Fleer, you may be treated with antibiotic medicines taken by mouth (orally). You will need to drink fluids to remain hydrated. °· If the infection is more severe, you may need to stay in the hospital and receive antibiotics given directly into a vein through an IV. You may also need to receive fluids through an IV if you are not able to remain hydrated. After your hospital stay, you may need to take oral antibiotics for a period of time. °Other treatments may be required, depending on the cause of the infection. °Follow these instructions at home: °Medicines °· Take your antibiotic medicine as told by your health care provider. Do not stop taking the antibiotic even if you start to feel better. °· Take over-the-counter and prescription medicines only as told by your health care provider. °General instructions ° °· Drink enough fluid to keep your urine pale yellow. °· Avoid caffeine, tea, and carbonated beverages. They tend to irritate the bladder. °· Urinate often. Avoid holding in urine for long periods of time. °· Urinate before and after sex. °· After a bowel movement, women should cleanse from front to back. Use each tissue only once. °· Keep all follow-up visits as told by your health care provider. This is important. °Contact a health care provider if: °· Your symptoms do not get better after 2 days of treatment. °· Your symptoms get worse. °·   You have a fever. °Get help right away if you: °· Are unable to take your antibiotics or fluids. °· Have shaking chills. °· Vomit. °· Have severe flank or back pain. °· Have extreme weakness or fainting. °Summary °· Pyelonephritis is a urinary tract infection (UTI) that occurs in the kidney. °· Treatment for this condition may depend on the severity of the  infection. °· Take your antibiotic medicine as told by your health care provider. Do not stop taking the antibiotic even if you start to feel better. °· Drink enough fluid to keep your urine pale yellow. °· Keep all follow-up visits as told by your health care provider. This is important. °This information is not intended to replace advice given to you by your health care provider. Make sure you discuss any questions you have with your health care provider. °Document Revised: 06/22/2018 Document Reviewed: 06/22/2018 °[employer] Patient Education © 2020 [employer] ° °

## 2020-06-20 LAB — COMPLETE METABOLIC PANEL WITH GFR
AG Ratio: 1.5 (calc) (ref 1.0–2.5)
ALT: 39 U/L — ABNORMAL HIGH (ref 6–29)
AST: 22 U/L (ref 10–30)
Albumin: 4.2 g/dL (ref 3.6–5.1)
Alkaline phosphatase (APISO): 72 U/L (ref 31–125)
BUN/Creatinine Ratio: 19 (calc) (ref 6–22)
BUN: 38 mg/dL — ABNORMAL HIGH (ref 7–25)
CO2: 20 mmol/L (ref 20–32)
Calcium: 9 mg/dL (ref 8.6–10.2)
Chloride: 106 mmol/L (ref 98–110)
Creat: 2.04 mg/dL — ABNORMAL HIGH (ref 0.50–1.10)
GFR, Est African American: 36 mL/min/{1.73_m2} — ABNORMAL LOW (ref >=60)
GFR, Est Non African American: 31 mL/min/{1.73_m2} — ABNORMAL LOW (ref >=60)
Globulin: 2.8 g/dL (calc) (ref 1.9–3.7)
Glucose, Bld: 121 mg/dL (ref 65–139)
Potassium: 4.4 mmol/L (ref 3.5–5.3)
Sodium: 136 mmol/L (ref 135–146)
Total Bilirubin: 0.4 mg/dL (ref 0.2–1.2)
Total Protein: 7 g/dL (ref 6.1–8.1)

## 2021-05-23 ENCOUNTER — Other Ambulatory Visit: Payer: Self-pay | Admitting: Physician Assistant

## 2021-06-13 ENCOUNTER — Other Ambulatory Visit: Payer: Self-pay | Admitting: Physician Assistant

## 2021-06-14 ENCOUNTER — Telehealth: Payer: Self-pay | Admitting: Neurology

## 2021-06-14 MED ORDER — ALLOPURINOL 100 MG PO TABS: 100.0000 mg | ORAL_TABLET | Freq: Every day | ORAL | 0 refills | Status: AC

## 2021-06-14 NOTE — Telephone Encounter (Signed)
Called patient and she stated that she now moved to [place] and was questioning if she could have up to two weeks refill on this medication until she can find a new Primary dr. in [place] since she is completely out. AM

## 2021-06-14 NOTE — Addendum Note (Signed)
Addended byAnnamaria Helling on: 06/14/2021 04:05 PM   Modules accepted: Orders

## 2021-06-14 NOTE — Telephone Encounter (Signed)
Patient called asking for refills of Allopurinol. She has not been seen in over a year. Needs an appt. Please call and schedule. [phone].

## 2021-06-14 NOTE — Telephone Encounter (Signed)
RX sent for two weeks.

## 2021-06-23 IMAGING — US US ABDOMEN LIMITED
1 series · 7 of 7 positions shown · non-contrast
Comparison: None.

CLINICAL DATA: Right upper quadrant and left upper quadrant lumps.
Probable lipoma.

EXAM:
ULTRASOUND ABDOMEN LIMITED

[Series 1: us abdomen limited · 7 acquisitions, 7 frames shown]
[im 1/7]
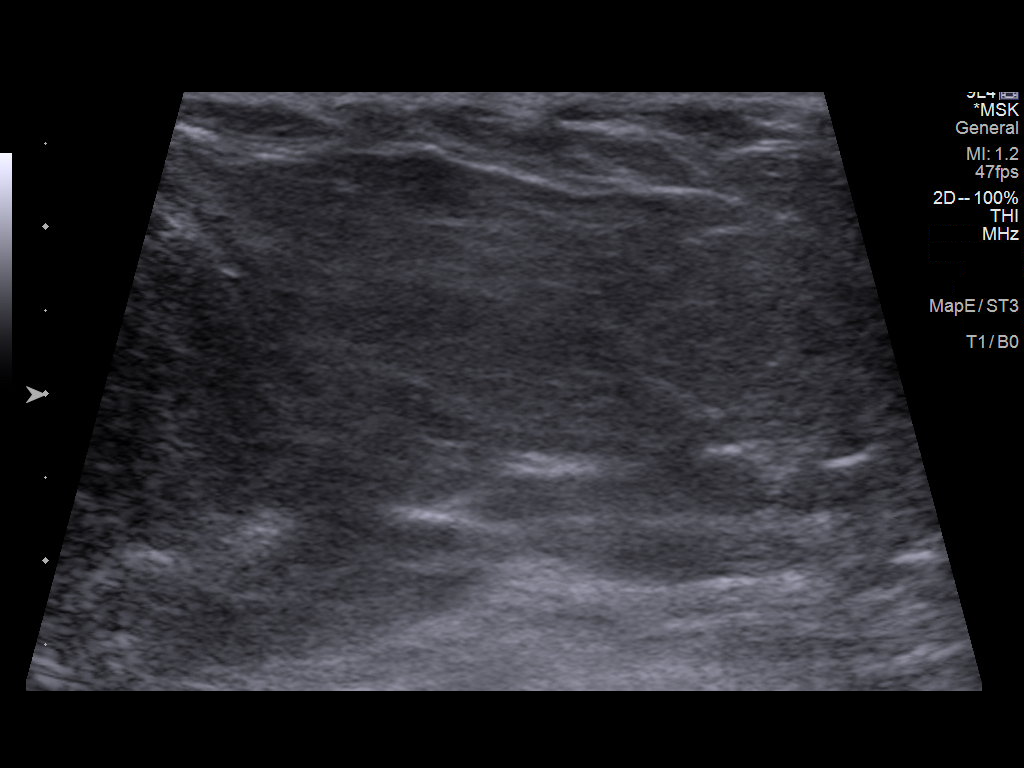
[im 2/7]
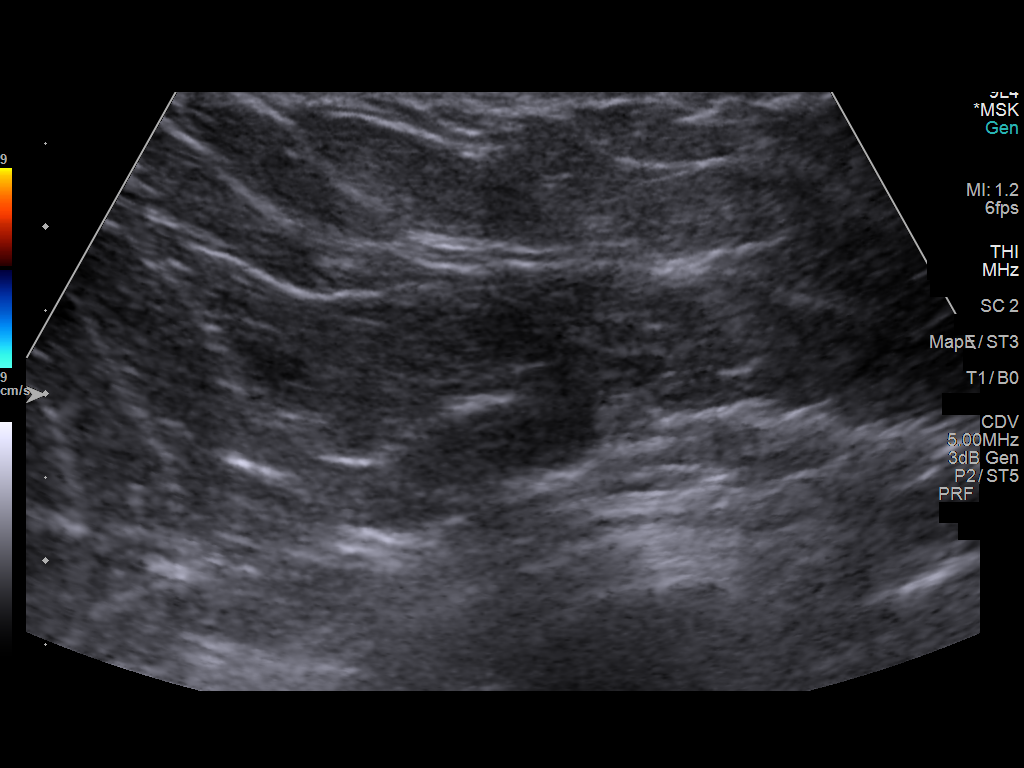
[im 3/7]
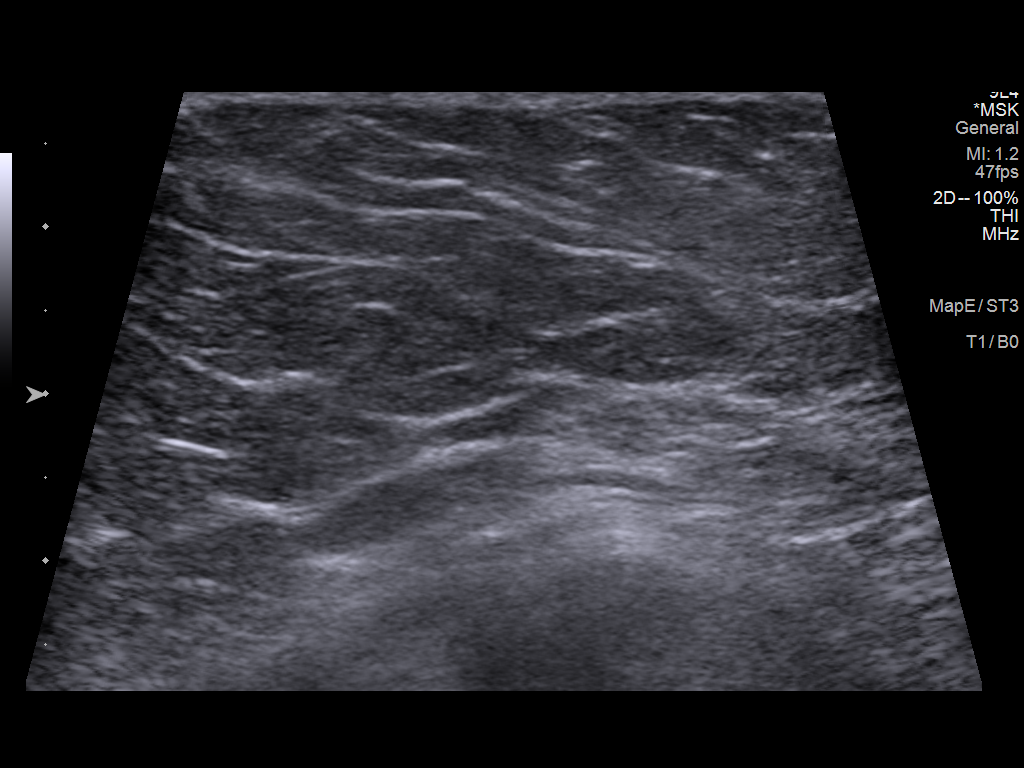
[im 4/7]
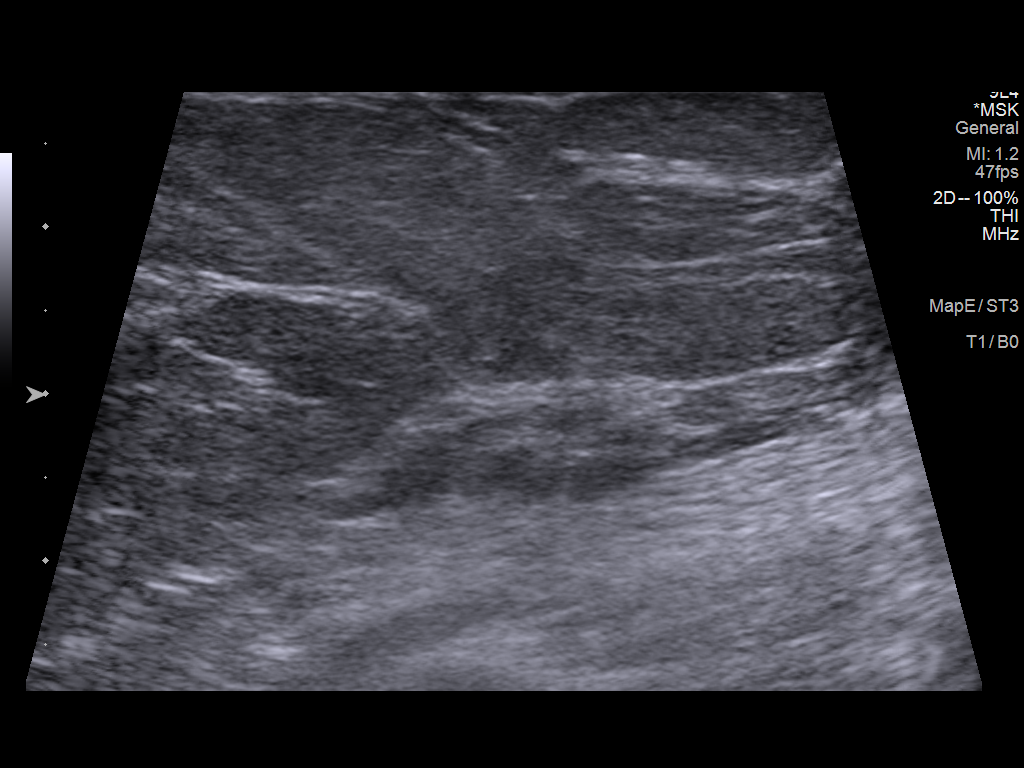
[im 5/7]
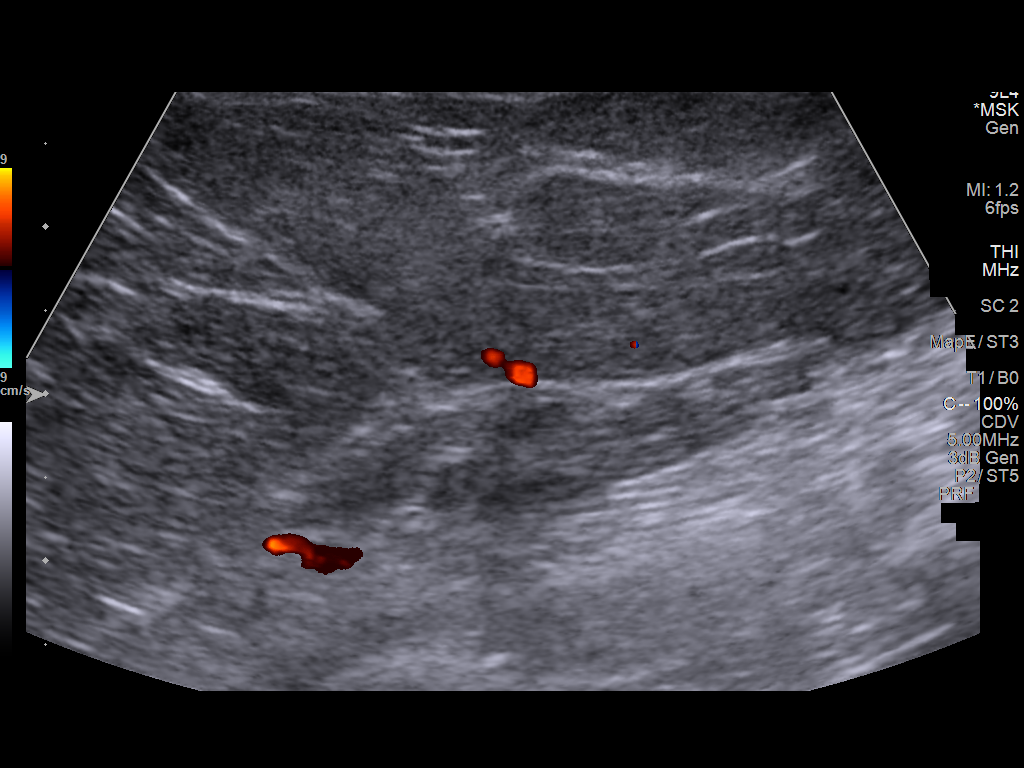
[im 6/7]
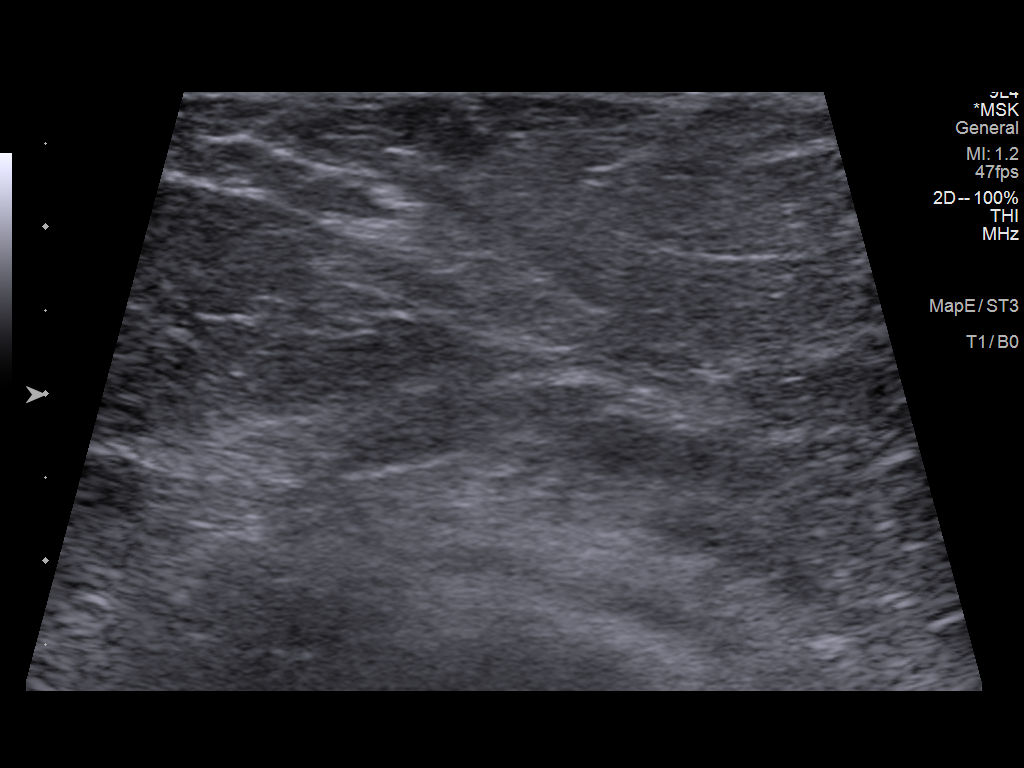
[im 7/7]
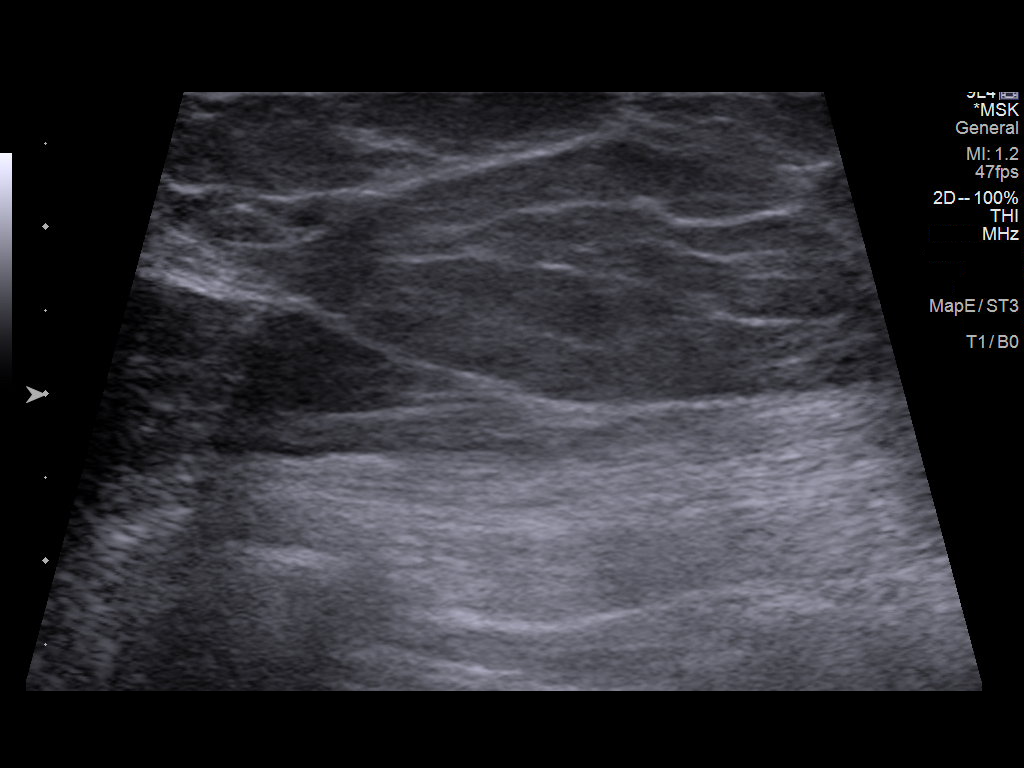

[7 of 7 positions shown; findings below may reference images not displayed]

FINDINGS: Targeted sonographic evaluation in the area of clinical concern in
the left upper quadrant demonstrates homogeneous appearance of the
subcutaneous fat. Two separate areas of concern were evaluated. No
evidence of encapsulated lesion. No focal fluid collection. Patient
reports no areas of concern in the right upper quadrant.
IMPRESSION: No sonographic abnormality in the soft tissues of the left upper
quadrant in the area of clinical concern. Consider further
evaluation with MRI based on clinical concern.
